# Patient Record
Sex: Female | Born: 2000 | Race: White | Hispanic: No | Smoking: Never smoker
Health system: Southern US, Community
[De-identification: ages and names within clinical notes are randomized; demographics above are authoritative.]

## PROBLEM LIST (undated history)

## (undated) ENCOUNTER — Inpatient Hospital Stay: Payer: Self-pay

## (undated) DIAGNOSIS — F419 Anxiety disorder, unspecified: Secondary | ICD-10-CM

## (undated) DIAGNOSIS — E669 Obesity, unspecified: Secondary | ICD-10-CM

## (undated) DIAGNOSIS — O4693 Antepartum hemorrhage, unspecified, third trimester: Secondary | ICD-10-CM

## (undated) DIAGNOSIS — J45909 Unspecified asthma, uncomplicated: Secondary | ICD-10-CM

## (undated) HISTORY — DX: Anxiety disorder, unspecified: F41.9

## (undated) HISTORY — DX: Antepartum hemorrhage, unspecified, third trimester: O46.93

## (undated) HISTORY — DX: Unspecified asthma, uncomplicated: J45.909

## (undated) HISTORY — PX: TONSILLECTOMY: SUR1361

---
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
## 2021-01-05 DIAGNOSIS — H5213 Myopia, bilateral: Secondary | ICD-10-CM | POA: Diagnosis not present

## 2021-02-20 ENCOUNTER — Other Ambulatory Visit: Payer: Self-pay

## 2021-02-20 ENCOUNTER — Ambulatory Visit
Admission: EM | Admit: 2021-02-20 | Discharge: 2021-02-20 | Disposition: A | Discharge status: Home / Self Care | Payer: Medicaid Other | Attending: Physician Assistant | Admitting: Physician Assistant

## 2021-02-20 ENCOUNTER — Encounter: Payer: Self-pay | Admitting: Emergency Medicine

## 2021-02-20 DIAGNOSIS — J45909 Unspecified asthma, uncomplicated: Secondary | ICD-10-CM | POA: Diagnosis not present

## 2021-02-20 DIAGNOSIS — M79602 Pain in left arm: Secondary | ICD-10-CM | POA: Insufficient documentation

## 2021-02-20 DIAGNOSIS — Z309 Encounter for contraceptive management, unspecified: Secondary | ICD-10-CM | POA: Diagnosis not present

## 2021-02-20 DIAGNOSIS — Z760 Encounter for issue of repeat prescription: Secondary | ICD-10-CM | POA: Diagnosis not present

## 2021-02-20 DIAGNOSIS — M5412 Radiculopathy, cervical region: Secondary | ICD-10-CM | POA: Diagnosis not present

## 2021-02-20 LAB — POCT PREGNANCY, URINE: Preg Test, Ur: NEGATIVE

## 2021-02-20 MED ORDER — PREDNISONE 10 MG PO TABS: ORAL_TABLET | ORAL | 0 refills | Status: DC

## 2021-02-20 MED ORDER — ALBUTEROL SULFATE HFA 108 (90 BASE) MCG/ACT IN AERS: 1.0000 | INHALATION_SPRAY | Freq: Four times a day (QID) | RESPIRATORY_TRACT | 0 refills | Status: DC | PRN

## 2021-02-20 MED ORDER — BACLOFEN 10 MG PO TABS: 10.0000 mg | ORAL_TABLET | Freq: Three times a day (TID) | ORAL | 0 refills | Status: DC | PRN

## 2021-02-20 MED ORDER — NORELGESTROMIN-ETH ESTRADIOL 150-35 MCG/24HR TD PTWK: 1.0000 | MEDICATED_PATCH | TRANSDERMAL | 2 refills | Status: DC

## 2021-02-20 NOTE — Discharge Instructions (Addendum)
Your arm pain is likely coming from your neck.  You likely have a pinched nerve.  I have sent prednisone to help reduce inflammation.  We will send a muscle relaxer.  You can take Tylenol 2 if you need to for pain.  You may need to follow-up with orthopedics if not getting better after the corticosteroids.  NECK PAIN: Stressed avoiding painful activities. This can exacerbate your symptoms and make them worse.  May apply heat to the areas of pain for some relief. Use medications as directed. Be aware of which medications make you drowsy and do not drive or operate any kind of heavy machinery while using the medication (ie pain medications or muscle relaxers). F/U with PCP for reexamination or return sooner if condition worsens or does not begin to improve over the next few days.   NECK PAIN RED FLAGS: If symptoms get worse than they are right now, you should come back sooner for re-evaluation. If you have increased numbness/ tingling or notice that the numbness/tingling is affecting the legs or saddle region, go to ER. If you ever lose continence go to ER.      MEDICATION REFILLS: I have refilled your birth control patch and also your albuterol inhaler.  Please contact Medicaid so that you can get set up with a primary care provider who can provide refills of these medications for you.  You also need routine lab work performed.

## 2021-02-20 NOTE — ED Provider Notes (Signed)
[facility]    CSN: [id number] Arrival date & time: 02/20/21  1517      History   Chief Complaint Chief Complaint  Patient presents with   Shoulder Pain    left    HPI Jennifer Lamb is a 20 y.o. female presenting with multiple complaints.  First she states that she has had chronic neck pain for "a while."  She also states that over the past month she has developed pain that radiates all the way down her left arm into her hand.  It is sometimes associated with numbness and tingling of all of the fingers.  She has full range of motion of her shoulder, elbow and wrist without pain.  No pain with movement of her neck.  Patient denies any injury.  She has not taken any medication to help relieve symptoms.  Additionally she complains of intermittent numbness and tingling of bilateral feet over the past 10 to 12 months.  Patient states that the symptoms resolve when she gets up and walks but she feels the symptoms whenever she sits.  She says she has been evaluated for this and was told to have low iron.  Patient does not have a PCP that she follows up with.  She says she was assessed by her OB/GYN.  She had a baby 10 months ago.  She is not breast-feeding.  She has not seen anyone recently for these complaints.  Additionally she requests a refill of her albuterol inhaler she has a history of asthma and is out of the inhaler.  She is not complaining of any chest pain or breathing difficulty at this time.  Next, she requests a refill of her birth control patch.  Patient states that her OB/GYN sent it in for her but the pharmacy did not fill it and she would like me to refill it.  She is sexually active and last time she had intercourse was 3 days ago but states that they used a condom.  Patient's last menstrual period was 12/26/2020.  Admits to irregular periods.  No other complaints.   HPI  Past Medical History:  Diagnosis Date   Anxiety    Asthma    History of blood  transfusion 10/2018   after Cesarean section 2020   Obese    Placenta previa antepartum in second trimester 07/30/2018   Placental abruption in third trimester 11/12/2018   Pregnancy 09/19/2018   Supervision of normal first teen pregnancy in first trimester 05/03/2018   Vaginal bleeding in pregnancy, third trimester     Patient Active Problem List   Diagnosis Date Noted   Normal labor 05/02/2020   Indication for care in labor or delivery 05/01/2020   Type A blood, Rh positive 04/20/2020   Obesity in pregnancy 10/29/2019   Adult BMI 40.0-44.9 kg/sq m (HCC) 10/29/2019   Supervision of high risk pregnancy, antepartum 10/29/2019   Previous cesarean section complicating pregnancy 10/29/2019    Past Surgical History:  Procedure Laterality Date   CESAREAN SECTION N/A 11/12/2018   Procedure: CESAREAN SECTION;  Surgeon: Hildred Laserherry, Anika, MD;  Location: [facility] ORS;  Service: Obstetrics;  Laterality: N/A;  STAT   TONSILLECTOMY     20 yrs old    OB History     Gravida  2   Para  2   Term  1   Preterm  1   AB      Living  2      SAB  IAB      Ectopic      Multiple  0   Live Births  2            Home Medications    Prior to Admission medications   Medication Sig Start Date End Date Taking? Authorizing Provider  albuterol (VENTOLIN HFA) 108 (90 Base) MCG/ACT inhaler Inhale 1-2 puffs into the lungs every 6 (six) hours as needed for wheezing or shortness of breath. 02/20/21 02/20/22 Yes Shirlee Latch, PA-C  baclofen (LIORESAL) 10 MG tablet Take 1 tablet (10 mg total) by mouth 3 (three) times daily as needed for muscle spasms. 02/20/21  Yes Shirlee Latch, PA-C  predniSONE (DELTASONE) 10 MG tablet Take 6 tabs p.o. on the first day and decrease by 1 tablet daily until complete 02/20/21  Yes Eusebio Friendly B, PA-C  ferrous sulfate 325 (65 FE) MG tablet Take 1 tablet (325 mg total) by mouth daily with breakfast. 05/04/20   Lawhorn, Vanessa [place], CNM  norelgestromin-ethinyl  estradiol (ORTHO EVRA) 150-35 MCG/24HR transdermal patch Place 1 patch onto the skin once a week. 02/20/21   Shirlee Latch, PA-C    Family History Family History  Problem Relation Age of Onset   Diabetes Maternal Grandmother    Healthy Mother    Healthy Father    Breast cancer Neg Hx     Social History Social History   Tobacco Use   Smoking status: Never   Smokeless tobacco: Never  Vaping Use   Vaping Use: Former  Substance Use Topics   Alcohol use: Never   Drug use: Never     Allergies   Patient has no known allergies.   Review of Systems Review of Systems  Constitutional:  Negative for fatigue and fever.  Respiratory:  Negative for cough, shortness of breath and wheezing.   Cardiovascular:  Negative for chest pain.  Endocrine: Negative for polyphagia and polyuria.  Musculoskeletal:  Positive for arthralgias, back pain (chronic) and neck pain. Negative for gait problem, joint swelling and neck stiffness.  Skin:  Negative for rash.  Neurological:  Negative for weakness and numbness.    Physical Exam Triage Vital Signs ED Triage Vitals  Enc Vitals Group     BP 02/20/21 1612 136/78     Pulse Rate 02/20/21 1612 83     Resp 02/20/21 1612 14     Temp 02/20/21 1612 98.5 F (36.9 C)     Temp Source 02/20/21 1612 Oral     SpO2 02/20/21 1612 98 %     Weight 02/20/21 1609 260 lb (117.9 kg)     Height 02/20/21 1609 5\' 2"  (1.575 m)     Head Circumference --      Peak Flow --      Pain Score 02/20/21 1609 5     Pain Loc --      Pain Edu? --      Excl. in GC? --    No data found.  Updated Vital Signs BP 136/78 (BP Location: Left Arm)   Pulse 83   Temp 98.5 F (36.9 C) (Oral)   Resp 14   Ht 5\' 2"  (1.575 m)   Wt 260 lb (117.9 kg)   LMP 12/26/2020 (Approximate)   SpO2 98%   Breastfeeding No   BMI 47.55 kg/m      Physical Exam Vitals and nursing note reviewed.  Constitutional:      General: She is not in acute distress.    Appearance: Normal  appearance. She is not ill-appearing or toxic-appearing.  HENT:     Head: Normocephalic and atraumatic.  Eyes:     General: No scleral icterus.       Right eye: No discharge.        Left eye: No discharge.     Conjunctiva/sclera: Conjunctivae normal.  Cardiovascular:     Rate and Rhythm: Normal rate and regular rhythm.     Heart sounds: Normal heart sounds.  Pulmonary:     Effort: Pulmonary effort is normal. No respiratory distress.     Breath sounds: Normal breath sounds.  Musculoskeletal:     Left shoulder: Normal.     Cervical back: Neck supple. Tenderness (diffuse TTP left paracervical muscles) present. No swelling, rigidity or bony tenderness. No pain with movement. Normal range of motion.     Lumbar back: Tenderness (diffuse TTP lumbar region bilaterally) present. Normal range of motion.     Comments: 5/5 strength bilat UE and LE  Skin:    General: Skin is dry.  Neurological:     General: No focal deficit present.     Mental Status: She is alert. Mental status is at baseline.     Motor: No weakness.     Gait: Gait normal.  Psychiatric:        Mood and Affect: Mood normal.        Behavior: Behavior normal.        Thought Content: Thought content normal.     UC Treatments / Results  Labs (all labs ordered are listed, but only abnormal results are displayed) Labs Reviewed  POC URINE PREG, ED  POCT PREGNANCY, URINE    EKG   Radiology No results found.  Procedures Procedures (including critical care time)  Medications Ordered in UC Medications - No data to display  Initial Impression / Assessment and Plan / UC Course  I have reviewed the triage vital signs and the nursing notes.  Pertinent labs & imaging results that were available during my care of the patient were reviewed by me and considered in my medical decision making (see chart for details).  20 year old female presented with multiple complaints.  She complains of left arm pain.  She also complains  of neck pain.  Additionally she requests refills of birth control patch and albuterol.  Vitals are all normal and stable today.  Patient does have diffuse tenderness of the left paracervical muscles.  Full range of motion of the left shoulder without pain.  Suspect that her left arm pain is due to cervical radiculopathy.  Treating her with prednisone and baclofen.  Advised her if not improving with this treatment plan that she should follow-up with Ortho as she may need an MRI of her neck to assess for underlying bulging disc or other central abnormality causing her symptoms.  Patient agrees.  Urine pregnancy test negative.  I did refill her birth control patch.  Advised her to follow-up with her OB/GYN for continued refills.  Also I refilled her albuterol inhaler.  Advised her to contact Medicaid for her PCP so that she can get refills from PCP.  In regards to her numbness and tingling of her feet, advised her there are many different causes for this including hormonal abnormalities, electrolyte disturbances, vitamin deficiencies such as B vitamins, diabetes, or back problems.  Advised patient she needs to be evaluated to have labs performed.  Again, encouraged her to contact Medicaid for an appoint with her primary care provider.  Patient says this  has been ongoing for nearly a year.  Advised her for any acute worsening of any of her symptoms to go to ED.  Final Clinical Impressions(s) / UC Diagnoses   Final diagnoses:  Cervical radiculopathy  Left arm pain  Encounter for medication refill  Uncomplicated asthma, unspecified asthma severity, unspecified whether persistent  Encounter for contraceptive management, unspecified type     Discharge Instructions      Your arm pain is likely coming from your neck.  You likely have a pinched nerve.  I have sent prednisone to help reduce inflammation.  We will send a muscle relaxer.  You can take Tylenol 2 if you need to for pain.  You may need to  follow-up with orthopedics if not getting better after the corticosteroids.  NECK PAIN: Stressed avoiding painful activities. This can exacerbate your symptoms and make them worse.  May apply heat to the areas of pain for some relief. Use medications as directed. Be aware of which medications make you drowsy and do not drive or operate any kind of heavy machinery while using the medication (ie pain medications or muscle relaxers). F/U with PCP for reexamination or return sooner if condition worsens or does not begin to improve over the next few days.   NECK PAIN RED FLAGS: If symptoms get worse than they are right now, you should come back sooner for re-evaluation. If you have increased numbness/ tingling or notice that the numbness/tingling is affecting the legs or saddle region, go to ER. If you ever lose continence go to ER.      MEDICATION REFILLS: I have refilled your birth control patch and also your albuterol inhaler.  Please contact Medicaid so that you can get set up with a primary care provider who can provide refills of these medications for you.  You also need routine lab work performed.   ED Prescriptions     Medication Sig Dispense Auth. Provider   norelgestromin-ethinyl estradiol (ORTHO EVRA) 150-35 MCG/24HR transdermal patch Place 1 patch onto the skin once a week. 3 patch Shirlee Latch, PA-C   albuterol (VENTOLIN HFA) 108 (90 Base) MCG/ACT inhaler Inhale 1-2 puffs into the lungs every 6 (six) hours as needed for wheezing or shortness of breath. 1 g Eusebio Friendly B, PA-C   predniSONE (DELTASONE) 10 MG tablet Take 6 tabs p.o. on the first day and decrease by 1 tablet daily until complete 21 tablet Eusebio Friendly B, PA-C   baclofen (LIORESAL) 10 MG tablet Take 1 tablet (10 mg total) by mouth 3 (three) times daily as needed for muscle spasms. 30 each Gareth Morgan      PDMP not reviewed this encounter.   Shirlee Latch, PA-C 02/20/21 1712

## 2021-02-20 NOTE — ED Triage Notes (Signed)
Patient c/o left shoulder pain that started a month ago.  Patient states that the pain radiates down her left arm.  Patient states that pain comes and goes.  Patient denies any injury or fall.

## 2021-08-10 ENCOUNTER — Ambulatory Visit: Payer: Self-pay

## 2021-08-12 ENCOUNTER — Other Ambulatory Visit: Payer: Self-pay

## 2021-08-12 ENCOUNTER — Encounter: Payer: Self-pay | Admitting: Emergency Medicine

## 2021-08-12 ENCOUNTER — Ambulatory Visit
Admission: EM | Admit: 2021-08-12 | Discharge: 2021-08-12 | Disposition: A | Discharge status: Home / Self Care | Payer: Medicaid Other | Attending: Emergency Medicine | Admitting: Emergency Medicine

## 2021-08-12 DIAGNOSIS — J069 Acute upper respiratory infection, unspecified: Secondary | ICD-10-CM

## 2021-08-12 DIAGNOSIS — R051 Acute cough: Secondary | ICD-10-CM

## 2021-08-12 MED ORDER — IPRATROPIUM BROMIDE 0.06 % NA SOLN: 2.0000 | Freq: Four times a day (QID) | NASAL | 12 refills | Status: DC

## 2021-08-12 MED ORDER — BENZONATATE 100 MG PO CAPS: 200.0000 mg | ORAL_CAPSULE | Freq: Three times a day (TID) | ORAL | 0 refills | Status: DC

## 2021-08-12 MED ORDER — PROMETHAZINE-DM 6.25-15 MG/5ML PO SYRP: 5.0000 mL | ORAL_SOLUTION | Freq: Four times a day (QID) | ORAL | 0 refills | Status: DC | PRN

## 2021-08-12 MED ORDER — ALBUTEROL SULFATE HFA 108 (90 BASE) MCG/ACT IN AERS: 2.0000 | INHALATION_SPRAY | RESPIRATORY_TRACT | 0 refills | Status: DC | PRN

## 2021-08-12 MED ORDER — AMOXICILLIN-POT CLAVULANATE 875-125 MG PO TABS: 1.0000 | ORAL_TABLET | Freq: Two times a day (BID) | ORAL | 0 refills | Status: AC

## 2021-08-12 NOTE — ED Provider Notes (Signed)
[facility]    CSN: [id number] Arrival date & time: 08/12/21  1344      History   Chief Complaint Chief Complaint  Patient presents with   Cough   Sore Throat    HPI Jennifer Lamb is a 21 y.o. female.   HPI  21 year old female here for evaluation of respiratory complaints.  Patient reports that for the last 2 to 3 weeks she has been experiencing nasal congestion, sore throat, ear pain, cough that is productive for green sputum, shortness of breath, and wheezing.  She has a history of asthma and has been using her rescue inhaler.  She denies any fever, nasal discharge, or GI complaints.  Past Medical History:  Diagnosis Date   Anxiety    Asthma    History of blood transfusion 10/2018   after Cesarean section 2020   Obese    Placenta previa antepartum in second trimester 07/30/2018   Placental abruption in third trimester 11/12/2018   Pregnancy 09/19/2018   Supervision of normal first teen pregnancy in first trimester 05/03/2018   Vaginal bleeding in pregnancy, third trimester     Patient Active Problem List   Diagnosis Date Noted   Normal labor 05/02/2020   Indication for care in labor or delivery 05/01/2020   Type A blood, Rh positive 04/20/2020   Obesity in pregnancy 10/29/2019   Adult BMI 40.0-44.9 kg/sq m (HCC) 10/29/2019   Supervision of high risk pregnancy, antepartum 10/29/2019   Previous cesarean section complicating pregnancy 10/29/2019    Past Surgical History:  Procedure Laterality Date   CESAREAN SECTION N/A 11/12/2018   Procedure: CESAREAN SECTION;  Surgeon: Hildred Laser, MD;  Location: [facility] ORS;  Service: Obstetrics;  Laterality: N/A;  STAT   TONSILLECTOMY     21 yrs old    OB History     Gravida  2   Para  2   Term  1   Preterm  1   AB      Living  2      SAB      IAB      Ectopic      Multiple  0   Live Births  2            Home Medications    Prior to Admission medications   Medication Sig Start  Date End Date Taking? Authorizing Provider  albuterol (VENTOLIN HFA) 108 (90 Base) MCG/ACT inhaler Inhale 2 puffs into the lungs every 4 (four) hours as needed. 08/12/21  Yes Becky Augusta, NP  amoxicillin-clavulanate (AUGMENTIN) 875-125 MG tablet Take 1 tablet by mouth every 12 (twelve) hours for 10 days. 08/12/21 08/22/21 Yes Becky Augusta, NP  benzonatate (TESSALON) 100 MG capsule Take 2 capsules (200 mg total) by mouth every 8 (eight) hours. 08/12/21  Yes Becky Augusta, NP  ipratropium (ATROVENT) 0.06 % nasal spray Place 2 sprays into both nostrils 4 (four) times daily. 08/12/21  Yes Becky Augusta, NP  promethazine-dextromethorphan (PROMETHAZINE-DM) 6.25-15 MG/5ML syrup Take 5 mLs by mouth 4 (four) times daily as needed. 08/12/21  Yes Becky Augusta, NP    Family History Family History  Problem Relation Age of Onset   Diabetes Maternal Grandmother    Healthy Mother    Healthy Father    Breast cancer Neg Hx     Social History Social History   Tobacco Use   Smoking status: Never   Smokeless tobacco: Never  Vaping Use   Vaping Use: Former  Substance Use Topics  Alcohol use: Never   Drug use: Never     Allergies   Patient has no known allergies.   Review of Systems Review of Systems  Constitutional:  Negative for activity change, appetite change and fever.  HENT:  Positive for congestion, ear pain, rhinorrhea and sore throat.   Respiratory:  Positive for cough, shortness of breath and wheezing.   Gastrointestinal:  Negative for diarrhea, nausea and vomiting.  Skin:  Negative for rash.  Hematological: Negative.   Psychiatric/Behavioral: Negative.      Physical Exam Triage Vital Signs ED Triage Vitals  Enc Vitals Group     BP 08/12/21 1354 (!) 105/91     Pulse Rate 08/12/21 1354 (!) 112     Resp 08/12/21 1354 14     Temp 08/12/21 1354 98.3 F (36.8 C)     Temp Source 08/12/21 1354 Oral     SpO2 08/12/21 1354 100 %     Weight 08/12/21 1351 250 lb (113.4 kg)     Height  08/12/21 1351 5\' 2"  (1.575 m)     Head Circumference --      Peak Flow --      Pain Score 08/12/21 1350 2     Pain Loc --      Pain Edu? --      Excl. in GC? --    No data found.  Updated Vital Signs BP (!) 105/91 (BP Location: Left Arm)    Pulse (!) 112    Temp 98.3 F (36.8 C) (Oral)    Resp 14    Ht 5\' 2"  (1.575 m)    Wt 250 lb (113.4 kg)    LMP 07/08/2021 (Approximate)    SpO2 100%    Breastfeeding No    BMI 45.73 kg/m   Visual Acuity Right Eye Distance:   Left Eye Distance:   Bilateral Distance:    Right Eye Near:   Left Eye Near:    Bilateral Near:     Physical Exam Vitals and nursing note reviewed.  Constitutional:      General: She is not in acute distress.    Appearance: Normal appearance. She is not ill-appearing.  HENT:     Head: Normocephalic and atraumatic.     Right Ear: Tympanic membrane, ear canal and external ear normal. There is no impacted cerumen.     Left Ear: Tympanic membrane, ear canal and external ear normal. There is no impacted cerumen.     Nose: Congestion and rhinorrhea present.     Mouth/Throat:     Mouth: Mucous membranes are moist.     Pharynx: Oropharynx is clear. Posterior oropharyngeal erythema present.  Cardiovascular:     Rate and Rhythm: Normal rate and regular rhythm.     Pulses: Normal pulses.     Heart sounds: Normal heart sounds. No murmur heard.   No friction rub. No gallop.  Pulmonary:     Effort: Pulmonary effort is normal.     Breath sounds: Normal breath sounds. No wheezing, rhonchi or rales.  Musculoskeletal:     Cervical back: Normal range of motion and neck supple.  Lymphadenopathy:     Cervical: Cervical adenopathy present.  Skin:    General: Skin is warm and dry.     Capillary Refill: Capillary refill takes less than 2 seconds.     Findings: No erythema or rash.  Neurological:     General: No focal deficit present.     Mental Status: She is alert and  oriented to person, place, and time.  Psychiatric:         Mood and Affect: Mood normal.        Behavior: Behavior normal.        Thought Content: Thought content normal.        Judgment: Judgment normal.     UC Treatments / Results  Labs (all labs ordered are listed, but only abnormal results are displayed) Labs Reviewed - No data to display  EKG   Radiology No results found.  Procedures Procedures (including critical care time)  Medications Ordered in UC Medications - No data to display  Initial Impression / Assessment and Plan / UC Course  I have reviewed the triage vital signs and the nursing notes.  Pertinent labs & imaging results that were available during my care of the patient were reviewed by me and considered in my medical decision making (see chart for details).  Patient is a pleasant, nontoxic-appearing [age] year old female here for evaluation of respiratory complaints that been going on for last 2 to 3 weeks consist of nasal congestion, sore throat, ear pain, productive cough for green sputum, shortness of breath, and wheezing.  She denies any known sick contacts.  On physical exam patient has pearly-gray tympanic membranes bilaterally with normal light reflex and clear external auditory canals.  Nasal mucosa is erythematous and edematous with clear discharge in both nares.  Oropharyngeal exam reveals posterior oropharyngeal erythema with clear postnasal drip.  Patient does have bilateral anterior cervical lymphadenopathy on exam.  Cardiopulmonary exam reveals clear lung sounds in all fields.  Patient is requesting a refill of her albuterol inhaler and I am happy to do so.  Given the fact that she had symptoms for 2 to 3 weeks a trial of antibiotics is warranted and will prescribe Augmentin twice daily for 10 days.  To help with nasal congestion I will prescribe Atrovent nasal spray, Tessalon Perles, and Promethazine DM cough syrup.   Final Clinical Impressions(s) / UC Diagnoses   Final diagnoses:  Upper respiratory tract  infection, unspecified type  Acute cough     Discharge Instructions      Take the Augmentin twice daily with food for 10 days for your URI.   Use the Atrovent nasal spray, 2 squirts in each nostril every 6 hours, as needed for runny nose and postnasal drip.  Use the Tessalon Perles every 8 hours during the day.  Take them with a small sip of water.  They may give you some numbness to the base of your tongue or a metallic taste in your mouth, this is normal.  Use the Promethazine DM cough syrup at bedtime for cough and congestion.  It will make you drowsy so do not take it during the day.  Return for reevaluation or see your primary care provider for any new or worsening symptoms.      ED Prescriptions     Medication Sig Dispense Auth. Provider   albuterol (VENTOLIN HFA) 108 (90 Base) MCG/ACT inhaler Inhale 2 puffs into the lungs every 4 (four) hours as needed. 18 g Becky Augustayan, Melaya [place]el[place], NP   benzonatate (TESSALON) 100 MG capsule Take 2 capsules (200 mg total) by mouth every 8 (eight) hours. 21 capsule Becky Augustayan, Leah Skora, NP   amoxicillin-clavulanate (AUGMENTIN) 875-125 MG tablet Take 1 tablet by mouth every 12 (twelve) hours for 10 days. 20 tablet Becky Augustayan, Isahia Hollerbach, NP   ipratropium (ATROVENT) 0.06 % nasal spray Place 2 sprays into both nostrils 4 (four) times daily.  15 mL Becky Augustayan, Taeden Geller, NP   promethazine-dextromethorphan (PROMETHAZINE-DM) 6.25-15 MG/5ML syrup Take 5 mLs by mouth 4 (four) times daily as needed. 118 mL Becky Augustayan, Jaysion Ramseyer, NP      PDMP not reviewed this encounter.   Becky Augustayan, Giavonni Fonder, NP 08/12/21 1433

## 2021-08-12 NOTE — ED Triage Notes (Signed)
Patient c/o cough, congestion, sore throat that started 2-3 weeks ago.  Patient reports SOB that started earlier this week.  Patient denies fevers.

## 2021-08-12 NOTE — Discharge Instructions (Signed)
Take the Augmentin twice daily with food for 10 days for your URI.   Use the Atrovent nasal spray, 2 squirts in each nostril every 6 hours, as needed for runny nose and postnasal drip.  Use the Tessalon Perles every 8 hours during the day.  Take them with a small sip of water.  They may give you some numbness to the base of your tongue or a metallic taste in your mouth, this is normal.  Use the Promethazine DM cough syrup at bedtime for cough and congestion.  It will make you drowsy so do not take it during the day.  Return for reevaluation or see your primary care provider for any new or worsening symptoms.

## 2021-10-18 ENCOUNTER — Encounter: Payer: Medicaid Other | Admitting: Certified Nurse Midwife

## 2021-10-31 ENCOUNTER — Encounter: Payer: Medicaid Other | Admitting: Certified Nurse Midwife

## 2022-05-15 ENCOUNTER — Encounter: Payer: Self-pay | Admitting: Certified Nurse Midwife

## 2022-07-21 ENCOUNTER — Emergency Department
Admission: EM | Admit: 2022-07-21 | Discharge: 2022-07-21 | Disposition: A | Discharge status: Home / Self Care | Payer: Self-pay | Attending: Emergency Medicine | Admitting: Emergency Medicine

## 2022-07-21 ENCOUNTER — Encounter: Payer: Self-pay | Admitting: Emergency Medicine

## 2022-07-21 ENCOUNTER — Ambulatory Visit
Admission: EM | Admit: 2022-07-21 | Discharge: 2022-07-21 | Disposition: A | Discharge status: Home / Self Care | Payer: Medicaid Other | Attending: Family Medicine | Admitting: Family Medicine

## 2022-07-21 ENCOUNTER — Other Ambulatory Visit: Payer: Self-pay

## 2022-07-21 DIAGNOSIS — J45909 Unspecified asthma, uncomplicated: Secondary | ICD-10-CM | POA: Insufficient documentation

## 2022-07-21 DIAGNOSIS — L500 Allergic urticaria: Secondary | ICD-10-CM | POA: Insufficient documentation

## 2022-07-21 DIAGNOSIS — T7840XA Allergy, unspecified, initial encounter: Secondary | ICD-10-CM

## 2022-07-21 DIAGNOSIS — T782XXA Anaphylactic shock, unspecified, initial encounter: Secondary | ICD-10-CM

## 2022-07-21 MED ORDER — DIPHENHYDRAMINE HCL 25 MG PO CAPS
50.0000 mg | ORAL_CAPSULE | Freq: Once | ORAL | Status: AC
  Administered 2022-07-21: 50 mg via ORAL
  Filled 2022-07-21: qty 2

## 2022-07-21 MED ORDER — EPINEPHRINE PF 1 MG/ML IJ SOLN
0.3000 mg | Freq: Once | INTRAMUSCULAR | Status: AC
  Administered 2022-07-21: 0.3 mg via SUBCUTANEOUS

## 2022-07-21 MED ORDER — DEXAMETHASONE SODIUM PHOSPHATE 10 MG/ML IJ SOLN
10.0000 mg | Freq: Once | INTRAMUSCULAR | Status: AC
  Administered 2022-07-21: 10 mg via INTRAMUSCULAR

## 2022-07-21 MED ORDER — EPINEPHRINE 0.3 MG/0.3ML IJ SOAJ: 0.3000 mg | INTRAMUSCULAR | 0 refills | Status: DC | PRN

## 2022-07-21 MED ORDER — DIPHENHYDRAMINE HCL 50 MG/ML IJ SOLN
25.0000 mg | Freq: Once | INTRAMUSCULAR | Status: AC
  Administered 2022-07-21: 25 mg via INTRAMUSCULAR

## 2022-07-21 MED ORDER — PREDNISONE 20 MG PO TABS: 60.0000 mg | ORAL_TABLET | Freq: Every day | ORAL | 0 refills | Status: AC

## 2022-07-21 MED ORDER — FAMOTIDINE 20 MG PO TABS
20.0000 mg | ORAL_TABLET | Freq: Once | ORAL | Status: AC
  Administered 2022-07-21: 20 mg via ORAL
  Filled 2022-07-21: qty 1

## 2022-07-21 NOTE — ED Provider Notes (Signed)
[facility]    CSN: [id number] Arrival date & time: 07/21/22  1618      History   Chief Complaint Chief Complaint  Patient presents with   Urticaria    HPI Jennifer Lamb is a 21 y.o. female.   HPI   Jennifer Lamb presents for hives. She was playing with her baby around 7 AM and noticed a breakout on her neck. Rash has been spreading and is itchy. She put some calamine lotion on it without relief. She put on some lotion from [employer] and [employer] and recently switched up her laundry detergent. No meds or supplements or foods. No similar sx previous.   She has experienced some shortness of breath, feels like her throat is closing, and vomited once.     Past Medical History:  Diagnosis Date   Anxiety    Asthma    History of blood transfusion 10/2018   after Cesarean section 2020   Obese    Placenta previa antepartum in second trimester 07/30/2018   Placental abruption in third trimester 11/12/2018   Pregnancy 09/19/2018   Supervision of normal first teen pregnancy in first trimester 05/03/2018   Vaginal bleeding in pregnancy, third trimester     Patient Active Problem List   Diagnosis Date Noted   Normal labor 05/02/2020   Indication for care in labor or delivery 05/01/2020   Type A blood, Rh positive 04/20/2020   Obesity in pregnancy 10/29/2019   Adult BMI 40.0-44.9 kg/sq m (HCC) 10/29/2019   Supervision of high risk pregnancy, antepartum 10/29/2019   Previous cesarean section complicating pregnancy 10/29/2019    Past Surgical History:  Procedure Laterality Date   CESAREAN SECTION N/A 11/12/2018   Procedure: CESAREAN SECTION;  Surgeon: Hildred Laser, MD;  Location: [facility] ORS;  Service: Obstetrics;  Laterality: N/A;  STAT   TONSILLECTOMY     21 yrs old    OB History     Gravida  2   Para  2   Term  1   Preterm  1   AB      Living  2      SAB      IAB      Ectopic      Multiple  0   Live Births  2            Home  Medications    Prior to Admission medications   Medication Sig Start Date End Date Taking? Authorizing Provider  albuterol (VENTOLIN HFA) 108 (90 Base) MCG/ACT inhaler Inhale 2 puffs into the lungs every 4 (four) hours as needed. 08/12/21   Becky Augusta, NP  benzonatate (TESSALON) 100 MG capsule Take 2 capsules (200 mg total) by mouth every 8 (eight) hours. 08/12/21   Becky Augusta, NP  ipratropium (ATROVENT) 0.06 % nasal spray Place 2 sprays into both nostrils 4 (four) times daily. 08/12/21   Becky Augusta, NP  promethazine-dextromethorphan (PROMETHAZINE-DM) 6.25-15 MG/5ML syrup Take 5 mLs by mouth 4 (four) times daily as needed. 08/12/21   Becky Augusta, NP    Family History Family History  Problem Relation Age of Onset   Diabetes Maternal Grandmother    Healthy Mother    Healthy Father    Breast cancer Neg Hx     Social History Social History   Tobacco Use   Smoking status: Never   Smokeless tobacco: Never  Vaping Use   Vaping Use: Former  Substance Use Topics   Alcohol use: Never   Drug  use: Never     Allergies   Patient has no known allergies.   Review of Systems Review of Systems: negative unless otherwise stated in HPI.      Physical Exam Triage Vital Signs ED Triage Vitals  Enc Vitals Group     BP 07/21/22 1721 (!) 123/91     Pulse Rate 07/21/22 1721 (!) 102     Resp --      Temp 07/21/22 1721 97.8 F (36.6 C)     Temp Source 07/21/22 1721 Oral     SpO2 07/21/22 1721 100 %     Weight 07/21/22 1718 274 lb (124.3 kg)     Height 07/21/22 1718 5\' 2"  (1.575 m)     Head Circumference --      Peak Flow --      Pain Score 07/21/22 1718 0     Pain Loc --      Pain Edu? --      Excl. in GC? --    No data found.  Updated Vital Signs BP (!) 117/98 (BP Location: Left Arm)   Pulse (!) 110   Temp 97.8 F (36.6 C) (Oral)   Resp 14   Ht 5\' 2"  (1.575 m)   Wt 124.3 kg   LMP 07/07/2022 (Approximate)   SpO2 100%   BMI 50.12 kg/m   Visual Acuity Right Eye  Distance:   Left Eye Distance:   Bilateral Distance:    Right Eye Near:   Left Eye Near:    Bilateral Near:     Physical Exam GEN:     alert, non-toxic appearing female in no distress    HENT:  mucus membranes moist, oropharyngeal without lesions, elongated uvula, no nasal discharge EYES:   pupils equal and reactive, no scleral injection or discharge NECK:  normal ROM RESP:  no increased work of breathing, clear to auscultation bilaterally, no wheezing, rales, rhonchi CVS:   regular rhythm, tachycardic  Skin:   warm and dry, diffuse erythematous patches, cyanosis of fingers tips and toes     UC Treatments / Results  Labs (all labs ordered are listed, but only abnormal results are displayed) Labs Reviewed - No data to display  EKG   Radiology No results found.  Procedures Procedures (including critical care time)  Medications Ordered in UC Medications  dexamethasone (DECADRON) injection 10 mg (10 mg Intramuscular Given 07/21/22 1747)  diphenhydrAMINE (BENADRYL) injection 25 mg (25 mg Intramuscular Given 07/21/22 1747)  EPINEPHrine (ADRENALIN) 0.3 mg (0.3 mg Subcutaneous Given 07/21/22 1747)    Initial Impression / Assessment and Plan / UC Course  I have reviewed the triage vital signs and the nursing notes.  Pertinent labs & imaging results that were available during my care of the patient were reviewed by me and considered in my medical decision making (see chart for details).      Pt is a 21 y.o. female who presents for acute hives this morning. Jennifer Lamb is tachycardic with distal cyanosis. She feels short of breath though sats are in the 100%.  She vomited once.  I am concerned that she is anaphylactic.  She did not take any Benadryl at the onset of her symptoms as she has never had hives before.  She has never had hives before.  She was given IM Decadron 10 mg, 25 mg of Benadryl with epinephrine 0.3.  EMS was called.  Upon arrival, patient is agreeable to go to the  hospital. She texted her family member to  alert then that she will be traveling to the hospital.  She will travel with EMS to the [facility] regional ED.   Return and ED precautions given and voiced understanding. Discussed MDM, treatment plan and plan for follow-up with patient who agrees with plan.     Final Clinical Impressions(s) / UC Diagnoses   Final diagnoses:  Anaphylaxis, initial encounter     Discharge Instructions      You have some anaphylaxis.  You were given epinephrine, steroids and benadryl and will need to be evaluated in the emergency department.  You will travel there by EMS.     ED Prescriptions   None    PDMP not reviewed this encounter.   Katha Cabal, DO 07/21/22 1810

## 2022-07-21 NOTE — Discharge Instructions (Addendum)
You have some anaphylaxis.  You were given epinephrine, steroids and benadryl and will need to be evaluated in the emergency department.  You will travel there by EMS.

## 2022-07-21 NOTE — ED Triage Notes (Signed)
Pt to er room number 18 hallway via ems, per ems pt is from urgent care, states that she had a rash and hives all day, states that at urgent care she started to have some anxiety and was told she was having an anaphylactic reaction and was given, epi, benadryl and some decadron.  Pt states that she had some tightness in her throat when she was vomiting earlier today, but never felt like she couldn't breath,  states that they scared her at urgent care and that is what caused the sob.  Pt denies itchiness at this time, talking in full sentences, resps even and unlabored.

## 2022-07-21 NOTE — ED Triage Notes (Signed)
Patient reports hives that started all over her body this morning.  Patient reports putting on some body lotion last night before going to bed that might have caused this.

## 2022-07-21 NOTE — ED Notes (Signed)
EMS HAS BEEN CALLED AND IS ON ROUTE TO TRANSFER TO ED

## 2022-07-21 NOTE — ED Notes (Signed)
Hives to entire body. Swelling and redness. Meds given. Denies SOB or CP.

## 2022-07-21 NOTE — ED Notes (Signed)
Patient is being discharged from the Urgent Care and sent to the Emergency Department via EMS . Per Dr.Brimage, patient is in need of higher level of care due to Vomiting and Shortness of Breath. Patient is aware and verbalizes understanding of plan of care.  Vitals:   07/21/22 1721  BP: (!) 123/91  Pulse: (!) 102  Temp: 97.8 F (36.6 C)  SpO2: 100%

## 2022-07-21 NOTE — Discharge Instructions (Addendum)
Take benadryl (diphenhydramine) which you can get over the counter, 25mg  every 4 hours or 50mg  every 6 hours.  You can take pepcid (famotidine) 20mg  twice daily as well.  Take the prednisone as prescribed for 4 days starting tomorrow.  We have also prescribed an EpiPen.  This should be used if you have a severe allergic reaction including any shortness of breath tightness in your throat, or severe worsening hives.  Return to the ER immediately for new or worsening symptoms such as shortness of breath, throat tightness, worsening hives, fever, weakness, or any other new or worsening symptoms that concern you.

## 2022-07-21 NOTE — ED Provider Notes (Signed)
[facility] Provider Note    Event Date/Time   First MD Initiated Contact with Patient 07/21/22 1837     (approximate)   History   Allergic Reaction   HPI  Jennifer Lamb is a 21 y.o. female with history of asthma and anxiety who presents with concern for allergic reaction acute onset today.  The patient states that she was exposed to a new detergent.  She woke up today with hives all over her body that were very itchy.  She did not take anything for them.  Eventually she went to urgent care early this evening.  At that time she reported shortness of breath and also had 1 episode of vomiting.  She was then given epinephrine, Benadryl, and Decadron by urgent care and sent to the ED.  The patient states that she did feel like her throat was somewhat tight.  She felt slightly short of breath but the symptoms have both resolved.  She no longer feels nauseous.  She denies any prior history of allergic reactions like this and denies any known allergens.  I reviewed the medical records including the urgent care note from today.  The urgent care provider was concerned for anaphylaxis and the patient received IM epinephrine, Benadryl, and dexamethasone at approximately 1745.   Physical Exam   Triage Vital Signs: ED Triage Vitals  Enc Vitals Group     BP 07/21/22 1831 (!) 116/58     Pulse Rate 07/21/22 1831 (!) 104     Resp 07/21/22 1831 17     Temp 07/21/22 1831 98.1 F (36.7 C)     Temp Source 07/21/22 1831 Oral     SpO2 07/21/22 1831 98 %     Weight 07/21/22 1829 275 lb (124.7 kg)     Height 07/21/22 1829 5\' 2"  (1.575 m)     Head Circumference --      Peak Flow --      Pain Score 07/21/22 1829 0     Pain Loc --      Pain Edu? --      Excl. in GC? --     Most recent vital signs: Vitals:   07/21/22 1831  BP: (!) 116/58  Pulse: (!) 104  Resp: 17  Temp: 98.1 F (36.7 C)  SpO2: 98%     General: Awake, no distress.  CV:  Good peripheral  perfusion.  Resp:  Normal effort.  Lungs CTAB. Abd:  No distention.  Other:  Oropharynx clear with no swelling or erythema.  No stridor or pooled secretions.  Clear voice.  A few faint scattered urticaria to the upper body.   ED Results / Procedures / Treatments   Labs (all labs ordered are listed, but only abnormal results are displayed) Labs Reviewed - No data to display   EKG     RADIOLOGY    PROCEDURES:  Critical Care performed: No  Procedures   MEDICATIONS ORDERED IN ED: Medications - No data to display   IMPRESSION / MDM / ASSESSMENT AND PLAN / ED COURSE  I reviewed the triage vital signs and the nursing notes.  21 year old female with PMH as noted above presents for concern of possible anaphylactic reaction although she mainly had hives and just minimal discomfort in her throat.  The symptoms have now resolved after epinephrine, Decadron, and Benadryl.  Differential diagnosis includes, but is not limited to, anaphylaxis, allergic reaction.  Patient's presentation is most consistent with acute presentation with potential threat  to life or bodily function.  At this time there is no indication for lab workup.  We will observe the patient for approximately 3 hours from the time that she was given the epinephrine and if she has no recurrence of symptoms she will likely be appropriate for discharge home.  ----------------------------------------- 10:08 PM on 07/21/2022 -----------------------------------------    FINAL CLINICAL IMPRESSION(S) / ED DIAGNOSES   Final diagnoses:  None     Rx / DC Orders   ED Discharge Orders     None        Note:  This document was prepared using Dragon voice recognition software and may include unintentional dictation errors.

## 2022-10-09 ENCOUNTER — Encounter: Payer: Self-pay | Admitting: Emergency Medicine

## 2022-10-09 ENCOUNTER — Ambulatory Visit
Admission: EM | Admit: 2022-10-09 | Discharge: 2022-10-09 | Disposition: A | Discharge status: Home / Self Care | Payer: Medicaid Other | Attending: Emergency Medicine | Admitting: Emergency Medicine

## 2022-10-09 DIAGNOSIS — R21 Rash and other nonspecific skin eruption: Secondary | ICD-10-CM

## 2022-10-09 MED ORDER — PREDNISONE 10 MG (21) PO TBPK: ORAL_TABLET | Freq: Every day | ORAL | 0 refills | Status: DC

## 2022-10-09 NOTE — ED Triage Notes (Signed)
Pt presents with an itchy rash on her lower back x 3 days. Pt has tried Benadryl and Calamine lotion for her symptoms.

## 2022-10-09 NOTE — Discharge Instructions (Signed)
Rash appears inflammatory, no current signs of infection  Begin prednisone starting tomorrow as directed to reduce the inflammatory process which will slowly clear the rash  You may use oral or topical Benadryl to help manage itching, may continue use of calamine lotion as needed  Avoid long exposure to heat such as in the showers as this may cause further irritation  If your rash continues to persist please follow-up for reevaluation,   at any point if you begin to have trouble breathing administer your EpiPen and go to the nearest hospital

## 2022-10-09 NOTE — ED Provider Notes (Signed)
[facility]    CSN: [id number] Arrival date & time: 10/09/22  1517      History   Chief Complaint Chief Complaint  Patient presents with   Urticaria    HPI Jennifer Lamb is a 22 y.o. female.   Presents for rash present to the lower back for 3 days.  Rash is pruritic, nondraining or painful.  Has attempted use of calamine lotion and oral Benadryl which has been ineffective.  Endorses changing detergents 1 month ago, unsure if related.  Had similar symptoms with respiratory distress after changing detergents in December 2023.   Past Medical History:  Diagnosis Date   Anxiety    Asthma    History of blood transfusion 10/2018   after Cesarean section 2020   Obese    Placenta previa antepartum in second trimester 07/30/2018   Placental abruption in third trimester 11/12/2018   Pregnancy 09/19/2018   Supervision of normal first teen pregnancy in first trimester 05/03/2018   Vaginal bleeding in pregnancy, third trimester     Patient Active Problem List   Diagnosis Date Noted   Normal labor 05/02/2020   Indication for care in labor or delivery 05/01/2020   Type A blood, Rh positive 04/20/2020   Obesity in pregnancy 10/29/2019   Adult BMI 40.0-44.9 kg/sq m ([place]) 10/29/2019   Supervision of high risk pregnancy, antepartum 10/29/2019   Previous cesarean section complicating pregnancy [id number]    Past Surgical History:  Procedure Laterality Date   CESAREAN SECTION N/A 11/12/2018   Procedure: CESAREAN SECTION;  Surgeon: Rubie Maid, MD;  Location: [facility] ORS;  Service: Obstetrics;  Laterality: N/A;  STAT   TONSILLECTOMY     22 yrs old    OB History     Gravida  2   Para  2   Term  1   Preterm  1   AB      Living  2      SAB      IAB      Ectopic      Multiple  0   Live Births  2            Home Medications    Prior to Admission medications   Medication Sig Start Date End Date Taking? Authorizing Provider  albuterol (VENTOLIN  HFA) 108 (90 Base) MCG/ACT inhaler Inhale 2 puffs into the lungs every 4 (four) hours as needed. 08/12/21   Margarette Canada, NP  benzonatate (TESSALON) 100 MG capsule Take 2 capsules (200 mg total) by mouth every 8 (eight) hours. 08/12/21   Margarette Canada, NP  EPINEPHrine 0.3 mg/0.3 mL IJ SOAJ injection Inject 0.3 mg into the muscle as needed for anaphylaxis. 07/21/22   Arta Silence, MD  ipratropium (ATROVENT) 0.06 % nasal spray Place 2 sprays into both nostrils 4 (four) times daily. 08/12/21   Margarette Canada, NP  promethazine-dextromethorphan (PROMETHAZINE-DM) 6.25-15 MG/5ML syrup Take 5 mLs by mouth 4 (four) times daily as needed. 08/12/21   Margarette Canada, NP    Family History Family History  Problem Relation Age of Onset   Diabetes Maternal Grandmother    Healthy Mother    Healthy Father    Breast cancer Neg Hx     Social History Social History   Tobacco Use   Smoking status: Never   Smokeless tobacco: Never  Vaping Use   Vaping Use: Every day  Substance Use Topics   Alcohol use: Never   Drug use: Never  Allergies   Patient has no known allergies.   Review of Systems Review of Systems  Constitutional: Negative.   HENT: Negative.    Respiratory: Negative.    Cardiovascular: Negative.   Skin:  Positive for rash. Negative for color change, pallor and wound.     Physical Exam Triage Vital Signs ED Triage Vitals  Enc Vitals Group     BP 10/09/22 1557 121/89     Pulse Rate 10/09/22 1557 89     Resp 10/09/22 1557 18     Temp 10/09/22 1557 98.5 F (36.9 C)     Temp Source 10/09/22 1557 Oral     SpO2 10/09/22 1557 96 %     Weight --      Height --      Head Circumference --      Peak Flow --      Pain Score 10/09/22 1556 0     Pain Loc --      Pain Edu? --      Excl. in [place]? --    No data found.  Updated Vital Signs BP 121/89 (BP Location: Left Wrist)   Pulse 89   Temp 98.5 F (36.9 C) (Oral)   Resp 18   LMP 10/07/2022   SpO2 96%   Visual  Acuity Right Eye Distance:   Left Eye Distance:   Bilateral Distance:    Right Eye Near:   Left Eye Near:    Bilateral Near:     Physical Exam Constitutional:      Appearance: Normal appearance.  HENT:     Head: Normocephalic.  Eyes:     Extraocular Movements: Extraocular movements intact.  Pulmonary:     Effort: Pulmonary effort is normal.  Skin:    Comments: Hives are present to the bilateral lower back  Neurological:     Mental Status: She is alert and oriented to person, place, and time. Mental status is at baseline.      UC Treatments / Results  Labs (all labs ordered are listed, but only abnormal results are displayed) Labs Reviewed - No data to display  EKG   Radiology No results found.  Procedures Procedures (including critical care time)  Medications Ordered in UC Medications - No data to display  Initial Impression / Assessment and Plan / UC Course  I have reviewed the triage vital signs and the nursing notes.  Pertinent labs & imaging results that were available during my care of the patient were reviewed by me and considered in my medical decision making (see chart for details).  Rash  Appears inflammatory, no current signs of infection, discussed with patient, prescribed prednisone, declined steroid injection, recommended continued use oral oral or topical antihistamines and calamine lotion for management of pruritus, advised avoidance of long exposure to heat to prevent further irritation, may follow-up with urgent care as needed, primary care walking referral given Final Clinical Impressions(s) / UC Diagnoses   Final diagnoses:  None   Discharge Instructions   None    ED Prescriptions   None    PDMP not reviewed this encounter.   Hans Eden, NP 10/09/22 1609

## 2022-10-16 ENCOUNTER — Ambulatory Visit: Payer: Medicaid Other | Admitting: Physician Assistant

## 2022-10-16 ENCOUNTER — Encounter: Payer: Self-pay | Admitting: General Practice

## 2022-12-15 ENCOUNTER — Ambulatory Visit
Admission: EM | Admit: 2022-12-15 | Discharge: 2022-12-15 | Disposition: A | Discharge status: Home / Self Care | Payer: Medicaid Other | Attending: Family Medicine | Admitting: Family Medicine

## 2022-12-15 ENCOUNTER — Encounter: Payer: Self-pay | Admitting: Emergency Medicine

## 2022-12-15 DIAGNOSIS — J4521 Mild intermittent asthma with (acute) exacerbation: Secondary | ICD-10-CM

## 2022-12-15 MED ORDER — BENZONATATE 100 MG PO CAPS: 200.0000 mg | ORAL_CAPSULE | Freq: Three times a day (TID) | ORAL | 0 refills | Status: DC

## 2022-12-15 MED ORDER — AMOXICILLIN-POT CLAVULANATE 875-125 MG PO TABS: 1.0000 | ORAL_TABLET | Freq: Two times a day (BID) | ORAL | 0 refills | Status: DC

## 2022-12-15 MED ORDER — ALBUTEROL SULFATE HFA 108 (90 BASE) MCG/ACT IN AERS: 2.0000 | INHALATION_SPRAY | RESPIRATORY_TRACT | 2 refills | Status: DC | PRN

## 2022-12-15 MED ORDER — PREDNISONE 10 MG (21) PO TBPK: ORAL_TABLET | Freq: Every day | ORAL | 0 refills | Status: DC

## 2022-12-15 NOTE — Discharge Instructions (Addendum)
Stop by the pharmacy to pick up your prescriptions.  Follow up with your primary care provider as needed.  

## 2022-12-15 NOTE — ED Provider Notes (Signed)
[facility]    CSN: [id number] Arrival date & time: 12/15/22  1420      History   Chief Complaint Chief Complaint  Patient presents with   Cough    HPI Jennifer Lamb is a 22 y.o. female.   HPI   Jennifer Lamb presents for 2 weeks of cough and congestion. Started having a cold. Her partner and son had the same but they are getting better.   No fever, vomiting, diarrhea, eating and drinking well, rash or sore throat. No treatments prior to arrival.  She has asthma.       Past Medical History:  Diagnosis Date   Anxiety    Asthma    History of blood transfusion 10/2018   after Cesarean section 2020   Obese    Placenta previa antepartum in second trimester 07/30/2018   Placental abruption in third trimester 11/12/2018   Pregnancy 09/19/2018   Supervision of normal first teen pregnancy in first trimester 05/03/2018   Vaginal bleeding in pregnancy, third trimester     Patient Active Problem List   Diagnosis Date Noted   Normal labor 05/02/2020   Indication for care in labor or delivery 05/01/2020   Type A blood, Rh positive 04/20/2020   Obesity in pregnancy 10/29/2019   Adult BMI 40.0-44.9 kg/sq m (HCC) 10/29/2019   Supervision of high risk pregnancy, antepartum 10/29/2019   Previous cesarean section complicating pregnancy 10/29/2019    Past Surgical History:  Procedure Laterality Date   CESAREAN SECTION N/A 11/12/2018   Procedure: CESAREAN SECTION;  Surgeon: Hildred Laser, MD;  Location: [facility] ORS;  Service: Obstetrics;  Laterality: N/A;  STAT   TONSILLECTOMY     22 yrs old    OB History     Gravida  2   Para  2   Term  1   Preterm  1   AB      Living  2      SAB      IAB      Ectopic      Multiple  0   Live Births  2            Home Medications    Prior to Admission medications   Medication Sig Start Date End Date Taking? Authorizing Provider  amoxicillin-clavulanate (AUGMENTIN) 875-125 MG tablet Take 1 tablet by mouth  every 12 (twelve) hours. 12/15/22  Yes Peta Peachey, DO  albuterol (VENTOLIN HFA) 108 (90 Base) MCG/ACT inhaler Inhale 2 puffs into the lungs every 4 (four) hours as needed. 12/15/22   Enjoli Tidd, Seward Meth, DO  benzonatate (TESSALON) 100 MG capsule Take 2 capsules (200 mg total) by mouth every 8 (eight) hours. 12/15/22   Dhilan Brauer, Seward Meth, DO  EPINEPHrine 0.3 mg/0.3 mL IJ SOAJ injection Inject 0.3 mg into the muscle as needed for anaphylaxis. 07/21/22   Dionne Bucy, MD  ipratropium (ATROVENT) 0.06 % nasal spray Place 2 sprays into both nostrils 4 (four) times daily. 08/12/21   Becky Augusta, NP  predniSONE (STERAPRED UNI-PAK 21 TAB) 10 MG (21) TBPK tablet Take by mouth daily. Stop by the pharmacy to pick up your prescriptions.  Follow up with your primary care provider as needed. 12/15/22   Katha Cabal, DO    Family History Family History  Problem Relation Age of Onset   Diabetes Maternal Grandmother    Healthy Mother    Healthy Father    Breast cancer Neg Hx     Social History Social History   Tobacco  Use   Smoking status: Never   Smokeless tobacco: Never  Vaping Use   Vaping Use: Every day  Substance Use Topics   Alcohol use: Never   Drug use: Never     Allergies   Patient has no known allergies.   Review of Systems Review of Systems: negative unless otherwise stated in HPI.      Physical Exam Triage Vital Signs ED Triage Vitals  Enc Vitals Group     BP 12/15/22 1544 (!) 121/103     Pulse Rate 12/15/22 1544 (!) 110     Resp 12/15/22 1544 14     Temp 12/15/22 1544 98.2 F (36.8 C)     Temp Source 12/15/22 1544 Oral     SpO2 12/15/22 1544 100 %     Weight 12/15/22 1542 274 lb 14.6 oz (124.7 kg)     Height 12/15/22 1542 5\' 2"  (1.575 m)     Head Circumference --      Peak Flow --      Pain Score 12/15/22 1541 0     Pain Loc --      Pain Edu? --      Excl. in GC? --    No data found.  Updated Vital Signs BP (!) 121/103 (BP Location: Left Arm)   Pulse (!)  110   Temp 98.2 F (36.8 C) (Oral)   Resp 14   Ht 5\' 2"  (1.575 m)   Wt 124.7 kg   LMP 12/08/2022 (Approximate)   SpO2 100%   BMI 50.28 kg/m   Visual Acuity Right Eye Distance:   Left Eye Distance:   Bilateral Distance:    Right Eye Near:   Left Eye Near:    Bilateral Near:     Physical Exam GEN:     alert, non-toxic appearing female in no distress    HENT:  mucus membranes moist, oropharyngeal without lesions or erythema, no tonsillar hypertrophy or exudates, no nasal discharge EYES:   pupils equal and reactive, no scleral injection or discharge NECK:  normal ROM  RESP:  no increased work of breathing, faint expiratory wheezing, rhonchi throughout CVS:   regular rhythm, tachycardic Skin:   warm and dry, no rash on visible skin    UC Treatments / Results  Labs (all labs ordered are listed, but only abnormal results are displayed) Labs Reviewed - No data to display  EKG   Radiology No results found.  Procedures Procedures (including critical care time)  Medications Ordered in UC Medications - No data to display  Initial Impression / Assessment and Plan / UC Course  I have reviewed the triage vital signs and the nursing notes.  Pertinent labs & imaging results that were available during my care of the patient were reviewed by me and considered in my medical decision making (see chart for details).       Pt is a 22 y.o. female who presents for 2 weeks of cough that is not improving.  Courteny is  afebrile here without recent antipyretics. Satting well on room air. Overall pt is  non-toxic appearing, well hydrated, without respiratory distress. Pulmonary exam is remarkable for rhonchi that clear with cough and faint expiratory wheezing.  After shared decision making, we will not pursue chest x-ray at this time as it currently would not change management.  COVID  and influenza testing deferred due to length of symptoms.   Treat acute asthmatic bronchitis with  steroids and antibiotics as below.  Tessalon Perles  for cough and albuterol inhaler refilled.  Typical duration of symptoms discussed. Return and ED precautions given and patient voiced understanding.   Discussed MDM, treatment plan and plan for follow-up with patient who agrees with plan.      Final Clinical Impressions(s) / UC Diagnoses   Final diagnoses:  Mild intermittent asthmatic bronchitis with acute exacerbation     Discharge Instructions      Stop by the pharmacy to pick up your prescriptions.  Follow up with your primary care provider as needed.      ED Prescriptions     Medication Sig Dispense Auth. Provider   predniSONE (STERAPRED UNI-PAK 21 TAB) 10 MG (21) TBPK tablet Take by mouth daily. Stop by the pharmacy to pick up your prescriptions.  Follow up with your primary care provider as needed. 21 tablet Eldean Nanna, DO   benzonatate (TESSALON) 100 MG capsule Take 2 capsules (200 mg total) by mouth every 8 (eight) hours. 21 capsule Brinlynn Gorton, DO   albuterol (VENTOLIN HFA) 108 (90 Base) MCG/ACT inhaler Inhale 2 puffs into the lungs every 4 (four) hours as needed. 18 g Capri Raben, DO   amoxicillin-clavulanate (AUGMENTIN) 875-125 MG tablet Take 1 tablet by mouth every 12 (twelve) hours. 14 tablet Saed Hudlow, Seward Meth, DO      PDMP not reviewed this encounter.   Katha Cabal, DO 12/29/22 2350

## 2022-12-15 NOTE — ED Triage Notes (Signed)
Patient c/o cough and chest congestion since 12/01/22.  Patient denies fevers.

## 2023-02-24 ENCOUNTER — Ambulatory Visit
Admission: EM | Admit: 2023-02-24 | Discharge: 2023-02-24 | Disposition: A | Discharge status: Home / Self Care | Payer: Medicaid Other | Attending: Emergency Medicine | Admitting: Emergency Medicine

## 2023-02-24 DIAGNOSIS — N760 Acute vaginitis: Secondary | ICD-10-CM | POA: Insufficient documentation

## 2023-02-24 DIAGNOSIS — N390 Urinary tract infection, site not specified: Secondary | ICD-10-CM | POA: Insufficient documentation

## 2023-02-24 DIAGNOSIS — B9689 Other specified bacterial agents as the cause of diseases classified elsewhere: Secondary | ICD-10-CM | POA: Insufficient documentation

## 2023-02-24 LAB — CBC WITH DIFFERENTIAL/PLATELET
Abs Immature Granulocytes: 0.03 10*3/uL (ref 0.00–0.07)
Basophils Absolute: 0.1 10*3/uL (ref 0.0–0.1)
Basophils Relative: 1 %
Eosinophils Absolute: 0.1 10*3/uL (ref 0.0–0.5)
Eosinophils Relative: 1 %
HCT: 41.5 % (ref 36.0–46.0)
Hemoglobin: 13.7 g/dL (ref 12.0–15.0)
Immature Granulocytes: 0 %
Lymphocytes Relative: 23 %
Lymphs Abs: 2 10*3/uL (ref 0.7–4.0)
MCH: 28 pg (ref 26.0–34.0)
MCHC: 33 g/dL (ref 30.0–36.0)
MCV: 84.9 fL (ref 80.0–100.0)
Monocytes Absolute: 0.4 10*3/uL (ref 0.1–1.0)
Monocytes Relative: 4 %
Neutro Abs: 5.9 10*3/uL (ref 1.7–7.7)
Neutrophils Relative %: 71 %
Platelets: 324 10*3/uL (ref 150–400)
RBC: 4.89 MIL/uL (ref 3.87–5.11)
RDW: 13.9 % (ref 11.5–15.5)
WBC: 8.3 10*3/uL (ref 4.0–10.5)
nRBC: 0 % (ref 0.0–0.2)

## 2023-02-24 LAB — COMPREHENSIVE METABOLIC PANEL
ALT: 42 U/L (ref 0–44)
AST: 22 U/L (ref 15–41)
Albumin: 4.4 g/dL (ref 3.5–5.0)
Alkaline Phosphatase: 75 U/L (ref 38–126)
Anion gap: 10 (ref 5–15)
BUN: 16 mg/dL (ref 6–20)
CO2: 21 mmol/L — ABNORMAL LOW (ref 22–32)
Calcium: 9 mg/dL (ref 8.9–10.3)
Chloride: 105 mmol/L (ref 98–111)
Creatinine, Ser: 0.69 mg/dL (ref 0.44–1.00)
GFR, Estimated: >60 mL/min (ref >60)
Glucose, Bld: 98 mg/dL (ref 70–99)
Potassium: 3.8 mmol/L (ref 3.5–5.1)
Sodium: 136 mmol/L (ref 135–145)
Total Bilirubin: 0.6 mg/dL (ref 0.3–1.2)
Total Protein: 8.1 g/dL (ref 6.5–8.1)

## 2023-02-24 LAB — PREGNANCY, URINE: Preg Test, Ur: NEGATIVE

## 2023-02-24 LAB — URINALYSIS, W/ REFLEX TO CULTURE (INFECTION SUSPECTED)
Glucose, UA: NEGATIVE mg/dL
Nitrite: NEGATIVE
Protein, ur: 30 mg/dL — AB
Specific Gravity, Urine: 1.025 (ref 1.005–1.030)
pH: 6.5 (ref 5.0–8.0)

## 2023-02-24 LAB — WET PREP, GENITAL
Sperm: NONE SEEN
Trich, Wet Prep: NONE SEEN
WBC, Wet Prep HPF POC: >10 — AB (ref <10)
Yeast Wet Prep HPF POC: NONE SEEN

## 2023-02-24 MED ORDER — METRONIDAZOLE 500 MG PO TABS: 500.0000 mg | ORAL_TABLET | Freq: Two times a day (BID) | ORAL | 0 refills | Status: DC

## 2023-02-24 MED ORDER — NITROFURANTOIN MONOHYD MACRO 100 MG PO CAPS: 100.0000 mg | ORAL_CAPSULE | Freq: Two times a day (BID) | ORAL | 0 refills | Status: DC

## 2023-02-24 MED ORDER — ONDANSETRON 8 MG PO TBDP: 8.0000 mg | ORAL_TABLET | Freq: Three times a day (TID) | ORAL | 0 refills | Status: DC | PRN

## 2023-02-24 MED ORDER — PHENAZOPYRIDINE HCL 200 MG PO TABS: 200.0000 mg | ORAL_TABLET | Freq: Three times a day (TID) | ORAL | 0 refills | Status: DC

## 2023-02-24 NOTE — Discharge Instructions (Addendum)
Take the Flagyl (metronidazole) 500 mg twice daily for treatment of your bacterial vaginosis.  Avoid alcohol while on the metronidazole as taken together will cause of vomiting.  Bacterial vaginosis is often caused by a imbalance of bacteria in your vaginal vault.  This is sometimes a result of using tampons or hormonal fluctuations during her menstrual cycle.  You if your symptoms are recurrent you can try using a boric acid suppository twice weekly to help maintain the acid-base balance in your vagina vault which could prevent further infection.  You can also try vaginal probiotics to help return normal bacterial balance.   For your nausea vomiting please use the Zofran.  You can use 1 tablet every 8 hours as needed.  It will dissolve on or under your tongue.  Take the Macrobid twice daily for 5 days with food for treatment of urinary tract infection.  Use the Pyridium every 8 hours as needed for urinary discomfort.  This will turn your urine a bright red-orange.  Increase your oral fluid intake so that you increase your urine production and or flushing your urinary system.  Take an over-the-counter probiotic, such as Culturelle-Align-Activia, 1 hour after each dose of antibiotic to prevent diarrhea or yeast infections from forming.  We will culture urine and change the antibiotics if necessary.  Return for reevaluation, or see your primary care provider, for any new or worsening symptoms.

## 2023-02-24 NOTE — ED Triage Notes (Signed)
Sx started 1 week ago, vomit,nausea,headache, light headed, period 15 days late. Negative test.

## 2023-02-24 NOTE — ED Provider Notes (Signed)
[facility]    CSN: [id number] Arrival date & time: 02/24/23  1314      History   Chief Complaint Chief Complaint  Patient presents with   Emesis   Nausea   Headache    HPI Jennifer Lamb is a 22 y.o. female.   HPI  22 year old female with a past medical history significant for asthma, anxiety, and obesity presents for evaluation of intermittent headache for the last week that is responding to ibuprofen, nausea, vomiting, and diarrhea.  She also reports that she is felt slightly lightheaded.  She is eating and drinking but she does have a decreased appetite.  She also endorses some lower abdominal cramping pain.  She denies any runny nose, nasal congestion, fever, pain with urination, urinary urgency or frequency, vaginal discharge, or vaginal itching.  She is 12 days late on her menstrual cycle.  She has taken a home pregnancy test which was negative.  Past Medical History:  Diagnosis Date   Anxiety    Asthma    History of blood transfusion 10/2018   after Cesarean section 2020   Obese    Placenta previa antepartum in second trimester 07/30/2018   Placental abruption in third trimester 11/12/2018   Pregnancy 09/19/2018   Supervision of normal first teen pregnancy in first trimester 05/03/2018   Vaginal bleeding in pregnancy, third trimester     Patient Active Problem List   Diagnosis Date Noted   Normal labor 05/02/2020   Indication for care in labor or delivery 05/01/2020   Type A blood, Rh positive 04/20/2020   Obesity in pregnancy 10/29/2019   Adult BMI 40.0-44.9 kg/sq m (HCC) 10/29/2019   Supervision of high risk pregnancy, antepartum 10/29/2019   Previous cesarean section complicating pregnancy 10/29/2019    Past Surgical History:  Procedure Laterality Date   CESAREAN SECTION N/A 11/12/2018   Procedure: CESAREAN SECTION;  Surgeon: Hildred Laser, MD;  Location: [facility] ORS;  Service: Obstetrics;  Laterality: N/A;  STAT   TONSILLECTOMY     22 yrs old     OB History     Gravida  2   Para  2   Term  1   Preterm  1   AB      Living  2      SAB      IAB      Ectopic      Multiple  0   Live Births  2            Home Medications    Prior to Admission medications   Medication Sig Start Date End Date Taking? Authorizing Provider  metroNIDAZOLE (FLAGYL) 500 MG tablet Take 1 tablet (500 mg total) by mouth 2 (two) times daily. 02/24/23  Yes Becky Augusta, NP  nitrofurantoin, macrocrystal-monohydrate, (MACROBID) 100 MG capsule Take 1 capsule (100 mg total) by mouth 2 (two) times daily. 02/24/23  Yes Becky Augusta, NP  ondansetron (ZOFRAN-ODT) 8 MG disintegrating tablet Take 1 tablet (8 mg total) by mouth every 8 (eight) hours as needed for nausea or vomiting. 02/24/23  Yes Becky Augusta, NP  phenazopyridine (PYRIDIUM) 200 MG tablet Take 1 tablet (200 mg total) by mouth 3 (three) times daily. 02/24/23  Yes Becky Augusta, NP    Family History Family History  Problem Relation Age of Onset   Diabetes Maternal Grandmother    Healthy Mother    Healthy Father    Breast cancer Neg Hx     Social History Social History  Tobacco Use   Smoking status: Never   Smokeless tobacco: Never  Vaping Use   Vaping status: Every Day  Substance Use Topics   Alcohol use: Never   Drug use: Never     Allergies   Patient has no known allergies.   Review of Systems Review of Systems  Constitutional:  Negative for fever.  HENT:  Negative for congestion, ear pain, rhinorrhea and sore throat.   Respiratory:  Positive for cough.   Gastrointestinal:  Positive for abdominal pain, diarrhea, nausea and vomiting.  Genitourinary:  Negative for dysuria, frequency, hematuria, urgency, vaginal discharge and vaginal pain.     Physical Exam Triage Vital Signs ED Triage Vitals  Encounter Vitals Group     BP      Systolic BP Percentile      Diastolic BP Percentile      Pulse      Resp      Temp      Temp src      SpO2      Weight       Height      Head Circumference      Peak Flow      Pain Score      Pain Loc      Pain Education      Exclude from Growth Chart    No data found.  Updated Vital Signs BP 135/84 (BP Location: Right Arm)   Pulse 89   Temp 98.3 F (36.8 C) (Oral)   Resp 16   Wt 265 lb (120.2 kg)   LMP 01/10/2023 (Approximate)   SpO2 98%   BMI 48.47 kg/m   Visual Acuity Right Eye Distance:   Left Eye Distance:   Bilateral Distance:    Right Eye Near:   Left Eye Near:    Bilateral Near:     Physical Exam Vitals and nursing note reviewed.  Constitutional:      Appearance: Normal appearance. She is not ill-appearing.  HENT:     Head: Normocephalic and atraumatic.  Cardiovascular:     Rate and Rhythm: Normal rate and regular rhythm.     Pulses: Normal pulses.     Heart sounds: Normal heart sounds. No murmur heard.    No friction rub. No gallop.  Pulmonary:     Effort: Pulmonary effort is normal.     Breath sounds: Normal breath sounds. No wheezing, rhonchi or rales.  Abdominal:     General: Abdomen is flat.     Tenderness: There is abdominal tenderness. There is no guarding or rebound.  Skin:    General: Skin is warm and dry.     Capillary Refill: Capillary refill takes less than 2 seconds.     Findings: No rash.  Neurological:     General: No focal deficit present.     Mental Status: She is alert and oriented to person, place, and time.      UC Treatments / Results  Labs (all labs ordered are listed, but only abnormal results are displayed) Labs Reviewed  WET PREP, GENITAL - Abnormal; Notable for the following components:      Result Value   Clue Cells Wet Prep HPF POC PRESENT (*)    WBC, Wet Prep HPF POC >10 (*)    All other components within normal limits  URINALYSIS, W/ REFLEX TO CULTURE (INFECTION SUSPECTED) - Abnormal; Notable for the following components:   APPearance HAZY (*)    Hgb urine dipstick TRACE (*)  Bilirubin Urine SMALL (*)    Ketones, ur TRACE (*)     Protein, ur 30 (*)    Leukocytes,Ua LARGE (*)    Bacteria, UA MANY (*)    All other components within normal limits  URINE CULTURE  CBC WITH DIFFERENTIAL/PLATELET  PREGNANCY, URINE  COMPREHENSIVE METABOLIC PANEL    EKG   Radiology No results found.  Procedures Procedures (including critical care time)  Medications Ordered in UC Medications - No data to display  Initial Impression / Assessment and Plan / UC Course  I have reviewed the triage vital signs and the nursing notes.  Pertinent labs & imaging results that were available during my care of the patient were reviewed by me and considered in my medical decision making (see chart for details).   Patient is a nontoxic-appearing 22 year old female presenting for evaluation of headache and GI symptoms as outlined in HPI above.  Also she is late on her menstrual cycle.  She denies any upper respiratory symptoms but she states she has had a mild infrequent nonproductive cough.  She has taken a home pregnancy test that was negative.  She states that her symptoms began when earlier in the week she had an episode of food poisoning.  She states that she is continue to have the headache, nausea, vomiting, diarrhea, and she also feels shaky.  She is not in any acute distress in the exam room.  Cardiopulmonary exam is benign.  Patient's abdomen is soft and flat with mild suprapubic and adnexal tenderness.  She describes her pain as cramping in her lower abdomen.  Given that she is 12 days late on her menstrual cycle it could be that she is either pregnant, despite having a negative home pregnancy test, or that she is getting ready to have her period.  She is eating and drinking in her vital signs do not reflect that she is dehydrated in any way as she has a normal heart rate of 89 and normal blood pressure 135/84.  Given that she has not had her menstrual cycle and she is typically regular I will order a vaginal wet prep to look for the presence  of BV which could delay her menstrual cycle.  Will also check urinalysis to look for the presence of infection and check urine pregnancy test.  Because of her nausea, vomiting, diarrhea I will also order CBC to look for the presence of infection and a CMP to evaluate patient's electrolytes.  Patient reports that she is not nauseous at this time.  Her headaches have been responsive to ibuprofen and she is not having headache at present.  CBC is unremarkable.  Urine pregnancy test is unremarkable.  Vaginal wet prep shows clue cells but negative for yeast or trichomonas.  Urinalysis shows hazy appearance with trace hemoglobin, small bilirubin, trace ketones, 30 protein, large leukocyte esterase but is negative for nitrites.  Reflex microscopy shows many bacteria with 11-20 RBCs.  I will send urine for culture.  I will discharge patient on the diagnosis of bacterial vaginosis and urinary tract infection.  This is most likely what is contributing to both her headache and her delayed menstrual cycle.  I will treat the bacterial vaginosis with metronidazole 500 mg twice daily for 7 days.  I will prescribe Zofran that she can take every 8 hours as needed for nausea or vomiting.  For the UTI I will treat her with Macrobid 100 g twice daily for 5 days.  Final Clinical Impressions(s) / UC  Diagnoses   Final diagnoses:  Lower urinary tract infectious disease  BV (bacterial vaginosis)     Discharge Instructions      Take the Flagyl (metronidazole) 500 mg twice daily for treatment of your bacterial vaginosis.  Avoid alcohol while on the metronidazole as taken together will cause of vomiting.  Bacterial vaginosis is often caused by a imbalance of bacteria in your vaginal vault.  This is sometimes a result of using tampons or hormonal fluctuations during her menstrual cycle.  You if your symptoms are recurrent you can try using a boric acid suppository twice weekly to help maintain the acid-base balance  in your vagina vault which could prevent further infection.  You can also try vaginal probiotics to help return normal bacterial balance.   For your nausea vomiting please use the Zofran.  You can use 1 tablet every 8 hours as needed.  It will dissolve on or under your tongue.  Take the Macrobid twice daily for 5 days with food for treatment of urinary tract infection.  Use the Pyridium every 8 hours as needed for urinary discomfort.  This will turn your urine a bright red-orange.  Increase your oral fluid intake so that you increase your urine production and or flushing your urinary system.  Take an over-the-counter probiotic, such as Culturelle-Align-Activia, 1 hour after each dose of antibiotic to prevent diarrhea or yeast infections from forming.  We will culture urine and change the antibiotics if necessary.  Return for reevaluation, or see your primary care provider, for any new or worsening symptoms.      ED Prescriptions     Medication Sig Dispense Auth. Provider   nitrofurantoin, macrocrystal-monohydrate, (MACROBID) 100 MG capsule Take 1 capsule (100 mg total) by mouth 2 (two) times daily. 10 capsule Becky Augusta, NP   phenazopyridine (PYRIDIUM) 200 MG tablet Take 1 tablet (200 mg total) by mouth 3 (three) times daily. 6 tablet Becky Augusta, NP   metroNIDAZOLE (FLAGYL) 500 MG tablet Take 1 tablet (500 mg total) by mouth 2 (two) times daily. 14 tablet Becky Augusta, NP   ondansetron (ZOFRAN-ODT) 8 MG disintegrating tablet Take 1 tablet (8 mg total) by mouth every 8 (eight) hours as needed for nausea or vomiting. 20 tablet Becky Augusta, NP      PDMP not reviewed this encounter.   Becky Augusta, NP 02/24/23 1409

## 2023-02-25 LAB — URINE CULTURE: Special Requests: NORMAL

## 2023-06-04 ENCOUNTER — Ambulatory Visit
Admission: EM | Admit: 2023-06-04 | Discharge: 2023-06-04 | Disposition: A | Discharge status: Home / Self Care | Payer: Medicaid Other | Attending: Emergency Medicine | Admitting: Emergency Medicine

## 2023-06-04 DIAGNOSIS — R059 Cough, unspecified: Secondary | ICD-10-CM | POA: Diagnosis present

## 2023-06-04 DIAGNOSIS — Z8709 Personal history of other diseases of the respiratory system: Secondary | ICD-10-CM | POA: Diagnosis not present

## 2023-06-04 DIAGNOSIS — B9789 Other viral agents as the cause of diseases classified elsewhere: Secondary | ICD-10-CM | POA: Diagnosis not present

## 2023-06-04 DIAGNOSIS — F17290 Nicotine dependence, other tobacco product, uncomplicated: Secondary | ICD-10-CM | POA: Insufficient documentation

## 2023-06-04 DIAGNOSIS — Z1152 Encounter for screening for COVID-19: Secondary | ICD-10-CM | POA: Diagnosis not present

## 2023-06-04 DIAGNOSIS — J069 Acute upper respiratory infection, unspecified: Secondary | ICD-10-CM | POA: Diagnosis not present

## 2023-06-04 LAB — RESP PANEL BY RT-PCR (FLU A&B, COVID) ARPGX2
Influenza A by PCR: NEGATIVE
Influenza B by PCR: NEGATIVE
SARS Coronavirus 2 by RT PCR: NEGATIVE

## 2023-06-04 MED ORDER — ALBUTEROL SULFATE HFA 108 (90 BASE) MCG/ACT IN AERS: 2.0000 | INHALATION_SPRAY | RESPIRATORY_TRACT | 0 refills | Status: DC | PRN

## 2023-06-04 NOTE — ED Triage Notes (Signed)
Sx started 10-31  Cough- chest pain under breast-sob- nasal congestion Hx of asthma  Patient states that people in her home are sick.

## 2023-06-04 NOTE — Discharge Instructions (Signed)
Check MyChart for results.  May take over-the-counter meds as directed, use albuterol as directed, take daily allergy medicine of your choice.  Push fluids.  Return as needed

## 2023-06-04 NOTE — ED Provider Notes (Signed)
[facility]    CSN: [id number] Arrival date & time: 06/04/23  1018      History   Chief Complaint Chief Complaint  Patient presents with   Headache   Back Pain    HPI Jennifer Lamb is a 22 y.o. female.   22 year old female, Jennifer Lamb, presents to urgent care for evaluation of cough, chest pain under left breast, shortness of breath, nasal congestion that started 05/31/23,  patient states she has a history of asthma and people in her home are sick. Pt is out of her inhaler.  Patient requesting COVID and flu testing and work note.  The history is provided by the patient. No language interpreter was used.    Past Medical History:  Diagnosis Date   Anxiety    Asthma    History of blood transfusion 10/2018   after Cesarean section 2020   Obese    Placenta previa antepartum in second trimester 07/30/2018   Placental abruption in third trimester 11/12/2018   Pregnancy 09/19/2018   Supervision of normal first teen pregnancy in first trimester 05/03/2018   Vaginal bleeding in pregnancy, third trimester     Patient Active Problem List   Diagnosis Date Noted   Viral URI with cough 06/04/2023   History of asthma 06/04/2023   Normal labor 05/02/2020   Indication for care in labor or delivery 05/01/2020   Type A blood, Rh positive 04/20/2020   Obesity in pregnancy 10/29/2019   Adult BMI 40.0-44.9 kg/sq m (HCC) 10/29/2019   Supervision of high risk pregnancy, antepartum 10/29/2019   Previous cesarean section complicating pregnancy 10/29/2019    Past Surgical History:  Procedure Laterality Date   CESAREAN SECTION N/A 11/12/2018   Procedure: CESAREAN SECTION;  Surgeon: Hildred Laser, MD;  Location: [facility] ORS;  Service: Obstetrics;  Laterality: N/A;  STAT   TONSILLECTOMY     22 yrs old    OB History     Gravida  2   Para  2   Term  1   Preterm  1   AB      Living  2      SAB      IAB      Ectopic      Multiple  0   Live Births  2             Home Medications    Prior to Admission medications   Medication Sig Start Date End Date Taking? Authorizing Provider  albuterol (VENTOLIN HFA) 108 (90 Base) MCG/ACT inhaler Inhale 2 puffs into the lungs every 4 (four) hours as needed for wheezing or shortness of breath. 06/04/23   Jashley Yellin, Para March, NP  metroNIDAZOLE (FLAGYL) 500 MG tablet Take 1 tablet (500 mg total) by mouth 2 (two) times daily. 02/24/23   Becky Augusta, NP  nitrofurantoin, macrocrystal-monohydrate, (MACROBID) 100 MG capsule Take 1 capsule (100 mg total) by mouth 2 (two) times daily. 02/24/23   Becky Augusta, NP  ondansetron (ZOFRAN-ODT) 8 MG disintegrating tablet Take 1 tablet (8 mg total) by mouth every 8 (eight) hours as needed for nausea or vomiting. 02/24/23   Becky Augusta, NP  phenazopyridine (PYRIDIUM) 200 MG tablet Take 1 tablet (200 mg total) by mouth 3 (three) times daily. 02/24/23   Becky Augusta, NP    Family History Family History  Problem Relation Age of Onset   Diabetes Maternal Grandmother    Healthy Mother    Healthy Father    Breast cancer Neg Hx  Social History Social History   Tobacco Use   Smoking status: Never   Smokeless tobacco: Never  Vaping Use   Vaping status: Every Day  Substance Use Topics   Alcohol use: Never   Drug use: Never     Allergies   Patient has no known allergies.   Review of Systems Review of Systems  Constitutional:  Positive for chills.  HENT:  Positive for congestion.   Respiratory:  Positive for cough. Negative for shortness of breath, wheezing and stridor.   All other systems reviewed and are negative.    Physical Exam Triage Vital Signs ED Triage Vitals  Encounter Vitals Group     BP 06/04/23 1146 (!) 165/88     Systolic BP Percentile --      Diastolic BP Percentile --      Pulse Rate 06/04/23 1146 99     Resp 06/04/23 1146 18     Temp 06/04/23 1146 98.3 F (36.8 C)     Temp Source 06/04/23 1146 Oral     SpO2 06/04/23 1146 100 %      Weight --      Height --      Head Circumference --      Peak Flow --      Pain Score 06/04/23 1145 0     Pain Loc --      Pain Education --      Exclude from Growth Chart --    No data found.  Updated Vital Signs BP (!) 165/88 (BP Location: Right Wrist)   Pulse 99   Temp 98.3 F (36.8 C) (Oral)   Resp 18   LMP 05/21/2023 (Approximate)   SpO2 100%   Visual Acuity Right Eye Distance:   Left Eye Distance:   Bilateral Distance:    Right Eye Near:   Left Eye Near:    Bilateral Near:     Physical Exam Vitals and nursing note reviewed.  Constitutional:      Appearance: Normal appearance. She is well-developed. She is obese.  HENT:     Head: Normocephalic.     Right Ear: Tympanic membrane is retracted.     Left Ear: Tympanic membrane is retracted.     Nose: Mucosal edema and congestion present.     Right Sinus: No maxillary sinus tenderness or frontal sinus tenderness.     Left Sinus: No maxillary sinus tenderness or frontal sinus tenderness.     Mouth/Throat:     Lips: Pink.     Mouth: Mucous membranes are moist.     Pharynx: Oropharynx is clear. Uvula midline.  Eyes:     General: Lids are normal.     Pupils: Pupils are equal, round, and reactive to light.  Neck:     Trachea: Trachea normal.  Cardiovascular:     Rate and Rhythm: Normal rate and regular rhythm.     Pulses: Normal pulses.     Heart sounds: Normal heart sounds.  Pulmonary:     Effort: Pulmonary effort is normal.     Breath sounds: Normal breath sounds and air entry.  Musculoskeletal:     Cervical back: Normal range of motion.  Neurological:     General: No focal deficit present.     Mental Status: She is alert and oriented to person, place, and time.     GCS: GCS eye subscore is 4. GCS verbal subscore is 5. GCS motor subscore is 6.     Cranial Nerves: No cranial nerve  deficit.     Sensory: No sensory deficit.  Psychiatric:        Attention and Perception: Attention normal.        Mood and  Affect: Mood normal.        Speech: Speech normal.        Behavior: Behavior normal. Behavior is cooperative.      UC Treatments / Results  Labs (all labs ordered are listed, but only abnormal results are displayed) Labs Reviewed  RESP PANEL BY RT-PCR (FLU A&B, COVID) ARPGX2    EKG   Radiology No results found.  Procedures Procedures (including critical care time)  Medications Ordered in UC Medications - No data to display  Initial Impression / Assessment and Plan / UC Course  I have reviewed the triage vital signs and the nursing notes.  Pertinent labs & imaging results that were available during my care of the patient were reviewed by me and considered in my medical decision making (see chart for details).     Ddx: Viral URI with cough, history of asthma, allergies Final Clinical Impressions(s) / UC Diagnoses   Final diagnoses:  Viral URI with cough  History of asthma     Discharge Instructions      Check MyChart for results.  May take over-the-counter meds as directed, use albuterol as directed, take daily allergy medicine of your choice.  Push fluids.  Return as needed     ED Prescriptions     Medication Sig Dispense Auth. Provider   albuterol (VENTOLIN HFA) 108 (90 Base) MCG/ACT inhaler  (Status: Discontinued) Inhale 2 puffs into the lungs every 4 (four) hours as needed for wheezing or shortness of breath. 1 each Tell Rozelle, Para March, NP   albuterol (VENTOLIN HFA) 108 (90 Base) MCG/ACT inhaler Inhale 2 puffs into the lungs every 4 (four) hours as needed for wheezing or shortness of breath. 1 each Ubah Radke, Para March, NP      PDMP not reviewed this encounter.   Clancy Gourd, NP 06/04/23 1444

## 2023-06-12 ENCOUNTER — Ambulatory Visit (INDEPENDENT_AMBULATORY_CARE_PROVIDER_SITE_OTHER): Payer: Medicaid Other | Admitting: Certified Nurse Midwife

## 2023-06-12 ENCOUNTER — Other Ambulatory Visit (HOSPITAL_COMMUNITY)
Admission: RE | Admit: 2023-06-12 | Discharge: 2023-06-12 | Disposition: A | Discharge status: Home / Self Care | Payer: Medicaid Other | Source: Ambulatory Visit | Attending: Certified Nurse Midwife | Admitting: Certified Nurse Midwife

## 2023-06-12 ENCOUNTER — Encounter: Payer: Self-pay | Admitting: Certified Nurse Midwife

## 2023-06-12 VITALS — BP 125/75 | HR 99 | Ht 62.0 in | Wt 271.3 lb

## 2023-06-12 DIAGNOSIS — Z124 Encounter for screening for malignant neoplasm of cervix: Secondary | ICD-10-CM | POA: Diagnosis not present

## 2023-06-12 DIAGNOSIS — Z01419 Encounter for gynecological examination (general) (routine) without abnormal findings: Secondary | ICD-10-CM

## 2023-06-12 DIAGNOSIS — N939 Abnormal uterine and vaginal bleeding, unspecified: Secondary | ICD-10-CM | POA: Diagnosis not present

## 2023-06-12 DIAGNOSIS — Z13220 Encounter for screening for lipoid disorders: Secondary | ICD-10-CM

## 2023-06-12 DIAGNOSIS — Z113 Encounter for screening for infections with a predominantly sexual mode of transmission: Secondary | ICD-10-CM | POA: Insufficient documentation

## 2023-06-12 NOTE — Progress Notes (Signed)
GYNECOLOGY ANNUAL PREVENTATIVE CARE ENCOUNTER NOTE  History:     Jennifer Lamb is a 22 y.o. G70P1102 female here for a routine annual gynecologic exam.  Current complaints: abnormal uterine bleeding, skips periods , sometimes they are early more often late. .   Denies abnormal vaginal bleeding, discharge, pelvic pain, problems with intercourse or other gynecologic concerns.     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 Contraception: none Last Pap: has not had.  Last mammogram:  n/a    Upstream - 06/12/23 1340       Pregnancy Intention Screening   Does the patient want to become pregnant in the next year? Yes    Does the patient's partner want to become pregnant in the next year? Yes    Would the patient like to discuss contraceptive options today? No            The pregnancy intention screening data noted above was reviewed. Potential methods of contraception were discussed. The patient elected to proceed with No data recorded.    Obstetric History OB History  Gravida Para Term Preterm AB Living  2 2 1 1   2   SAB IAB Ectopic Multiple Live Births        0 2    # Outcome Date GA Lbr Len/2nd Weight Sex Type Anes PTL Lv  2 Term 05/02/20 [redacted]w[redacted]d 06:00 / 00:36 6 lb 4.2 oz (2.84 kg) M Vag-Spont EPI  LIV  1 Preterm 11/12/18 [redacted]w[redacted]d  4 lb 9.4 oz (2.08 kg) M  Gen  LIV    Past Medical History:  Diagnosis Date   Anxiety    Asthma    History of blood transfusion 10/2018   after Cesarean section 2020   Obese    Placenta previa antepartum in second trimester 07/30/2018   Placental abruption in third trimester 11/12/2018   Pregnancy 09/19/2018   Supervision of normal first teen pregnancy in first trimester 05/03/2018   Vaginal bleeding in pregnancy, third  trimester     Past Surgical History:  Procedure Laterality Date   CESAREAN SECTION N/A 11/12/2018   Procedure: CESAREAN SECTION;  Surgeon: Hildred Laser, MD;  Location: [facility] ORS;  Service: Obstetrics;  Laterality: N/A;  STAT   TONSILLECTOMY     22 yrs old    Current Outpatient Medications on File Prior to Visit  Medication Sig Dispense Refill   albuterol (VENTOLIN HFA) 108 (90 Base) MCG/ACT inhaler Inhale 2 puffs into the lungs every 4 (four) hours as needed for wheezing or shortness of breath. 1 each 0   metroNIDAZOLE (FLAGYL) 500 MG tablet Take 1 tablet (500 mg total) by mouth 2 (two) times daily. 14 tablet 0   nitrofurantoin, macrocrystal-monohydrate, (MACROBID) 100 MG capsule Take 1 capsule (100 mg total) by mouth 2 (two) times daily. 10 capsule 0   ondansetron (ZOFRAN-ODT) 8 MG disintegrating tablet Take 1 tablet (8 mg total) by mouth every 8 (eight) hours as needed for nausea or vomiting. 20 tablet 0   phenazopyridine (PYRIDIUM) 200 MG tablet Take 1 tablet (200 mg total) by mouth 3 (three) times daily. 6 tablet 0   No current facility-administered medications on file prior to visit.    No Known Allergies  Social History:  reports that she has never smoked. She has never  used smokeless tobacco. She reports that she does not drink alcohol and does not use drugs.  Family History  Problem Relation Age of Onset   Diabetes Maternal Grandmother    Healthy Mother    Healthy Father    Breast cancer Neg Hx     The following portions of the patient's history were reviewed and updated as appropriate: allergies, current medications, past family history, past medical history, past social history, past surgical history and problem list.  Review of Systems Pertinent items noted in HPI and remainder of comprehensive ROS otherwise negative.  Physical Exam:  BP 125/75   Pulse 99   Ht 5\' 2"  (1.575 m) Comment: patient reports  Wt 271 lb 4.8 oz (123.1 kg)   LMP 05/21/2023 (Approximate)    BMI 49.62 kg/m  CONSTITUTIONAL: Well-developed, well-nourished female in no acute distress.  HENT:  Normocephalic, atraumatic, External right and left ear normal. Oropharynx is clear and moist EYES: Conjunctivae and EOM are normal. Pupils are equal, round, and reactive to light. No scleral icterus.  NECK: Normal range of motion, supple, no masses.  Normal thyroid.  SKIN: Skin is warm and dry. No rash noted. Not diaphoretic. No erythema. No pallor. MUSCULOSKELETAL: Normal range of motion. No tenderness.  No cyanosis, clubbing, or edema.  2+ distal pulses. NEUROLOGIC: Alert and oriented to person, place, and time. Normal reflexes, muscle tone coordination.  PSYCHIATRIC: Normal mood and affect. Normal behavior. Normal judgment and thought content. CARDIOVASCULAR: Normal heart rate noted, regular rhythm RESPIRATORY: Clear to auscultation bilaterally. Effort and breath sounds normal, no problems with respiration noted. BREASTS: Symmetric in size. No masses, tenderness, skin changes, nipple drainage, or lymphadenopathy bilaterally.  ABDOMEN: Soft, no distention noted.  No tenderness, rebound or guarding.  PELVIC: Normal appearing external genitalia and urethral meatus; normal appearing vaginal mucosa and cervix.  No abnormal discharge noted.  Pap smear obtained. Contact bleeding Normal uterine size, no other palpable masses, no uterine or adnexal tenderness.  .   Assessment and Plan:    1. Women's annual routine gynecological examination  Pap: Will follow up results of pap smear and manage accordingly. Mammogram : n/a  Labs: Lipid , TSH, testosterone, prolactin FSH/LH. U/s evaluate PCOS.  Refills: none Referral: none Encouraged weight loss for future pregnancy and to help regulate cycle. Routine preventative health maintenance measures emphasized. Please refer to After Visit Summary for other counseling recommendations.      Doreene Burke, CNM [facility]  [facility],   [facility]

## 2023-06-12 NOTE — Patient Instructions (Signed)

## 2023-06-13 ENCOUNTER — Other Ambulatory Visit: Payer: Medicaid Other

## 2023-06-13 LAB — LIPID PANEL
Chol/HDL Ratio: 4.2 ratio (ref 0.0–4.4)
Cholesterol, Total: 178 mg/dL (ref 100–199)
HDL: 42 mg/dL (ref >39)
LDL Chol Calc (NIH): 122 mg/dL — ABNORMAL HIGH (ref 0–99)
Triglycerides: 76 mg/dL (ref 0–149)
VLDL Cholesterol Cal: 14 mg/dL (ref 5–40)

## 2023-06-13 LAB — FSH/LH
FSH: 5.4 m[IU]/mL
LH: 6.3 m[IU]/mL

## 2023-06-13 LAB — TSH: TSH: 3.13 u[IU]/mL (ref 0.450–4.500)

## 2023-06-13 LAB — TESTOSTERONE: Testosterone: 30 ng/dL (ref 13–71)

## 2023-06-13 LAB — PROLACTIN: Prolactin: 6.5 ng/mL (ref 4.8–33.4)

## 2023-06-14 ENCOUNTER — Encounter: Payer: Self-pay | Admitting: Certified Nurse Midwife

## 2023-06-14 LAB — CERVICOVAGINAL ANCILLARY ONLY
Bacterial Vaginitis (gardnerella): NEGATIVE
Candida Glabrata: NEGATIVE
Candida Vaginitis: NEGATIVE
Chlamydia: NEGATIVE
Comment: NEGATIVE
Comment: NEGATIVE
Comment: NEGATIVE
Comment: NEGATIVE
Comment: NEGATIVE
Comment: NORMAL
Neisseria Gonorrhea: NEGATIVE
Trichomonas: NEGATIVE

## 2023-06-14 LAB — CYTOLOGY - PAP
Comment: NEGATIVE
Diagnosis: NEGATIVE
High risk HPV: NEGATIVE

## 2023-06-27 ENCOUNTER — Ambulatory Visit (INDEPENDENT_AMBULATORY_CARE_PROVIDER_SITE_OTHER): Payer: Medicaid Other | Admitting: Family Medicine

## 2023-06-27 DIAGNOSIS — Z91199 Patient's noncompliance with other medical treatment and regimen due to unspecified reason: Secondary | ICD-10-CM

## 2023-06-27 NOTE — Progress Notes (Signed)
Patient was not seen for appt d/t no call, no show, or late arrival >10 mins past appt time.   Elise T Payne, FNP  [facility] [address] [place], [place] [address] [phone] (phone) [phone] (fax) [facility]  

## 2023-07-10 ENCOUNTER — Other Ambulatory Visit: Payer: Medicaid Other

## 2023-07-10 ENCOUNTER — Telehealth: Payer: Self-pay | Admitting: Certified Nurse Midwife

## 2023-07-10 NOTE — Telephone Encounter (Signed)
Reached out to pt about gyn [place] that was scheduled on 07/10/2023 at 2:00 (per [place]).  Left message for pt to call back.

## 2023-07-11 ENCOUNTER — Encounter: Payer: Self-pay | Admitting: Certified Nurse Midwife

## 2023-07-11 NOTE — Telephone Encounter (Signed)
Reached out to pt (2x) about gyn [place] that was scheduled on 07/10/2023 at 2:00 (per [place]).  Left message for pt to call back.  Will send a MyChart letter to pt.

## 2023-07-12 ENCOUNTER — Encounter: Payer: Self-pay | Admitting: Certified Nurse Midwife

## 2023-07-27 ENCOUNTER — Ambulatory Visit: Payer: Medicaid Other | Admitting: Family Medicine

## 2023-08-01 HISTORY — PX: OTHER SURGICAL HISTORY: SHX169

## 2023-08-07 ENCOUNTER — Other Ambulatory Visit: Payer: Medicaid Other

## 2023-08-07 ENCOUNTER — Telehealth: Payer: Self-pay | Admitting: Certified Nurse Midwife

## 2023-08-07 NOTE — Telephone Encounter (Signed)
 Reached out to pt about the gyn [place] that was scheduled on 08/07/2023 at 9:14 at [facility].  Left message for pt to call back.

## 2023-08-08 ENCOUNTER — Encounter: Payer: Self-pay | Admitting: Certified Nurse Midwife

## 2023-08-08 NOTE — Telephone Encounter (Signed)
 Reached out to pt (2x) about the gyn [place] that was scheduled on 17/2025 at 9:15 at [facility].  Left message for pt to call back.  Will send a MyChart letter.

## 2023-09-10 DIAGNOSIS — N23 Unspecified renal colic: Secondary | ICD-10-CM | POA: Diagnosis not present

## 2023-09-10 DIAGNOSIS — N132 Hydronephrosis with renal and ureteral calculous obstruction: Secondary | ICD-10-CM | POA: Diagnosis not present

## 2023-09-10 DIAGNOSIS — R1031 Right lower quadrant pain: Secondary | ICD-10-CM | POA: Diagnosis not present

## 2023-09-11 DIAGNOSIS — R1031 Right lower quadrant pain: Secondary | ICD-10-CM | POA: Diagnosis not present

## 2023-09-11 DIAGNOSIS — N132 Hydronephrosis with renal and ureteral calculous obstruction: Secondary | ICD-10-CM | POA: Diagnosis not present

## 2023-09-12 DIAGNOSIS — N138 Other obstructive and reflux uropathy: Secondary | ICD-10-CM | POA: Insufficient documentation

## 2023-09-13 DIAGNOSIS — N1330 Unspecified hydronephrosis: Secondary | ICD-10-CM | POA: Diagnosis not present

## 2023-09-13 DIAGNOSIS — N201 Calculus of ureter: Secondary | ICD-10-CM | POA: Diagnosis not present

## 2023-09-13 DIAGNOSIS — J45909 Unspecified asthma, uncomplicated: Secondary | ICD-10-CM | POA: Diagnosis not present

## 2023-09-13 DIAGNOSIS — N390 Urinary tract infection, site not specified: Secondary | ICD-10-CM | POA: Diagnosis not present

## 2023-09-13 DIAGNOSIS — N134 Hydroureter: Secondary | ICD-10-CM | POA: Diagnosis not present

## 2023-09-13 DIAGNOSIS — N200 Calculus of kidney: Secondary | ICD-10-CM | POA: Diagnosis not present

## 2023-09-13 DIAGNOSIS — N138 Other obstructive and reflux uropathy: Secondary | ICD-10-CM | POA: Diagnosis not present

## 2023-09-13 DIAGNOSIS — Z466 Encounter for fitting and adjustment of urinary device: Secondary | ICD-10-CM | POA: Diagnosis not present

## 2023-09-13 DIAGNOSIS — N132 Hydronephrosis with renal and ureteral calculous obstruction: Secondary | ICD-10-CM | POA: Diagnosis not present

## 2023-09-14 DIAGNOSIS — N201 Calculus of ureter: Secondary | ICD-10-CM | POA: Diagnosis not present

## 2023-09-14 DIAGNOSIS — N390 Urinary tract infection, site not specified: Secondary | ICD-10-CM | POA: Diagnosis not present

## 2023-09-14 DIAGNOSIS — N138 Other obstructive and reflux uropathy: Secondary | ICD-10-CM | POA: Diagnosis not present

## 2023-10-18 DIAGNOSIS — N201 Calculus of ureter: Secondary | ICD-10-CM | POA: Diagnosis not present

## 2023-10-18 DIAGNOSIS — Z3189 Encounter for other procreative management: Secondary | ICD-10-CM | POA: Diagnosis not present

## 2023-10-23 ENCOUNTER — Ambulatory Visit: Admitting: Family Medicine

## 2023-10-25 DIAGNOSIS — J45909 Unspecified asthma, uncomplicated: Secondary | ICD-10-CM | POA: Diagnosis not present

## 2023-10-25 DIAGNOSIS — N201 Calculus of ureter: Secondary | ICD-10-CM | POA: Diagnosis not present

## 2023-10-31 ENCOUNTER — Ambulatory Visit: Admitting: Family Medicine

## 2023-11-19 ENCOUNTER — Encounter: Payer: Self-pay | Admitting: Physician Assistant

## 2023-11-19 ENCOUNTER — Ambulatory Visit
Admission: RE | Admit: 2023-11-19 | Discharge: 2023-11-19 | Disposition: A | Discharge status: Home / Self Care | Source: Ambulatory Visit | Attending: Physician Assistant | Admitting: Physician Assistant

## 2023-11-19 ENCOUNTER — Ambulatory Visit (INDEPENDENT_AMBULATORY_CARE_PROVIDER_SITE_OTHER)

## 2023-11-19 VITALS — BP 121/83 | HR 100 | Temp 98.7°F | Resp 18

## 2023-11-19 DIAGNOSIS — J209 Acute bronchitis, unspecified: Secondary | ICD-10-CM | POA: Diagnosis not present

## 2023-11-19 DIAGNOSIS — R051 Acute cough: Secondary | ICD-10-CM | POA: Diagnosis not present

## 2023-11-19 DIAGNOSIS — R059 Cough, unspecified: Secondary | ICD-10-CM | POA: Diagnosis not present

## 2023-11-19 DIAGNOSIS — R509 Fever, unspecified: Secondary | ICD-10-CM | POA: Diagnosis not present

## 2023-11-19 DIAGNOSIS — J45901 Unspecified asthma with (acute) exacerbation: Secondary | ICD-10-CM | POA: Diagnosis not present

## 2023-11-19 LAB — RESP PANEL BY RT-PCR (FLU A&B, COVID) ARPGX2
Influenza A by PCR: NEGATIVE
Influenza B by PCR: NEGATIVE
SARS Coronavirus 2 by RT PCR: NEGATIVE

## 2023-11-19 MED ORDER — PREDNISONE 20 MG PO TABS: 40.0000 mg | ORAL_TABLET | Freq: Every day | ORAL | 0 refills | Status: AC

## 2023-11-19 MED ORDER — PROMETHAZINE-DM 6.25-15 MG/5ML PO SYRP: 5.0000 mL | ORAL_SOLUTION | Freq: Four times a day (QID) | ORAL | 0 refills | Status: DC | PRN

## 2023-11-19 MED ORDER — ALBUTEROL SULFATE HFA 108 (90 BASE) MCG/ACT IN AERS: 1.0000 | INHALATION_SPRAY | Freq: Four times a day (QID) | RESPIRATORY_TRACT | 0 refills | Status: DC | PRN

## 2023-11-19 NOTE — Discharge Instructions (Addendum)
-   You are negative for COVID and flu. - Results of x-ray are pending.  If x-ray shows pneumonia you will be contacted and antibiotics will be sent. -If you do not not hear from us , presentation more consistent with viral illness and it is good sign that you have broken the fever. - I sent cough medicine and also an inhaler and prednisone  for asthma flareup. - Increase rest and fluids.  If fever returns or you begin feeling worse please consider reevaluation. - Cough related to bronchitis can last for couple weeks.

## 2023-11-19 NOTE — ED Triage Notes (Signed)
 Pt presents with a fever,headache, and bodyaches x 3 days. She also has a cough x 1 week.

## 2023-11-19 NOTE — ED Provider Notes (Signed)
 [facility]    CSN: [id number] Arrival date & time: 11/19/23  0917      History   Chief Complaint Chief Complaint  Patient presents with   Generalized Body Aches    Feeling sick with fever - Entered by patient   Cough   Fever    HPI Jennifer Lamb is a 23 y.o. female presenting for approximately 1 week history of dry cough.  Patient says over the past 3 days she developed fever up to 102 degrees with fatigue, chills, sweats, body aches and productive cough as well as shortness of breath.  History of asthma but has not had an inhaler.  Patient says she has not had a fever today.  Current temperature 98.7 degrees and has not taken any antipyretics.  She still reports feeling badly.  Report sore throat only when she coughs.  Denies any sick contacts.  No other complaints.  HPI  Past Medical History:  Diagnosis Date   Anxiety    Asthma    History of blood transfusion 10/2018   after Cesarean section 2020   Obese    Placenta previa antepartum in second trimester 07/30/2018   Placental abruption in third trimester 11/12/2018   Pregnancy 09/19/2018   Supervision of normal first teen pregnancy in first trimester 05/03/2018   Vaginal bleeding in pregnancy, third trimester     Patient Active Problem List   Diagnosis Date Noted   Encounter for fertility planning 10/18/2023   Ureterolithiasis 09/13/2023   Urinary tract obstruction due to kidney stone 09/12/2023   History of asthma 06/04/2023   Contact with and (suspected) exposure to covid-19 05/15/2020   Adult BMI 40.0-44.9 kg/sq m (HCC) 10/29/2019   Pregnancy of unknown anatomic location 08/30/2019    Past Surgical History:  Procedure Laterality Date   CESAREAN SECTION N/A 11/12/2018   Procedure: CESAREAN SECTION;  Surgeon: Teresa Fender, MD;  Location: [facility] ORS;  Service: Obstetrics;  Laterality: N/A;  STAT   TONSILLECTOMY     23 yrs old    OB History     Gravida  2   Para  2   Term  1   Preterm  1    AB      Living  2      SAB      IAB      Ectopic      Multiple  0   Live Births  2            Home Medications    Prior to Admission medications   Medication Sig Start Date End Date Taking? Authorizing Provider  albuterol  (VENTOLIN  HFA) 108 (90 Base) MCG/ACT inhaler Inhale 1-2 puffs into the lungs every 6 (six) hours as needed for wheezing or shortness of breath. 11/19/23  Yes Nancy Axon B, PA-C  predniSONE  (DELTASONE ) 20 MG tablet Take 2 tablets (40 mg total) by mouth daily for 5 days. 11/19/23 11/24/23 Yes Nancy Axon B, PA-C  promethazine -dextromethorphan (PROMETHAZINE -DM) 6.25-15 MG/5ML syrup Take 5 mLs by mouth 4 (four) times daily as needed. 11/19/23  Yes Floydene Hy, PA-C  albuterol  (VENTOLIN  HFA) 108 (90 Base) MCG/ACT inhaler Inhale 2 puffs into the lungs every 4 (four) hours as needed for wheezing or shortness of breath. 06/04/23   Defelice, Eveleen Hinds, NP    Family History Family History  Problem Relation Age of Onset   Diabetes Maternal Grandmother    Healthy Mother    Healthy Father    Breast cancer Neg  Hx     Social History Social History   Tobacco Use   Smoking status: Never   Smokeless tobacco: Never  Vaping Use   Vaping status: Former  Substance Use Topics   Alcohol use: Never   Drug use: Never     Allergies   Patient has no known allergies.   Review of Systems Review of Systems  Constitutional:  Positive for chills, diaphoresis, fatigue and fever.  HENT:  Positive for congestion, rhinorrhea and sore throat. Negative for ear pain, sinus pressure and sinus pain.   Respiratory:  Positive for cough and shortness of breath.   Cardiovascular:  Negative for chest pain.  Gastrointestinal:  Negative for abdominal pain, nausea and vomiting.  Musculoskeletal:  Positive for myalgias.  Skin:  Negative for rash.  Neurological:  Positive for headaches. Negative for weakness.  Hematological:  Negative for adenopathy.     Physical  Exam Triage Vital Signs ED Triage Vitals  Encounter Vitals Group     BP      Systolic BP Percentile      Diastolic BP Percentile      Pulse      Resp      Temp      Temp src      SpO2      Weight      Height      Head Circumference      Peak Flow      Pain Score      Pain Loc      Pain Education      Exclude from Growth Chart    No data found.  Updated Vital Signs BP 121/83 (BP Location: Right Wrist)   Pulse 100   Temp 98.7 F (37.1 C) (Oral)   Resp 18   LMP 11/16/2023 (Approximate)   SpO2 97%   Physical Exam Vitals and nursing note reviewed.  Constitutional:      General: She is not in acute distress.    Appearance: Normal appearance. She is not ill-appearing or toxic-appearing.  HENT:     Head: Normocephalic and atraumatic.     Right Ear: Tympanic membrane, ear canal and external ear normal.     Left Ear: Tympanic membrane, ear canal and external ear normal.     Nose: Congestion present.     Mouth/Throat:     Mouth: Mucous membranes are moist.     Pharynx: Oropharynx is clear.  Eyes:     General: No scleral icterus.       Right eye: No discharge.        Left eye: No discharge.     Conjunctiva/sclera: Conjunctivae normal.  Cardiovascular:     Rate and Rhythm: Normal rate and regular rhythm.     Heart sounds: Normal heart sounds.  Pulmonary:     Effort: Pulmonary effort is normal. No respiratory distress.     Breath sounds: Normal breath sounds.  Musculoskeletal:     Cervical back: Neck supple.  Skin:    General: Skin is dry.  Neurological:     General: No focal deficit present.     Mental Status: She is alert. Mental status is at baseline.     Motor: No weakness.     Gait: Gait normal.  Psychiatric:        Mood and Affect: Mood normal.        Behavior: Behavior normal.      UC Treatments / Results  Labs (all labs ordered are listed,  but only abnormal results are displayed) Labs Reviewed  RESP PANEL BY RT-PCR (FLU A&B, COVID) ARPGX2     EKG   Radiology DG Chest 2 View Result Date: 11/19/2023 CLINICAL DATA:  Cough for the past week and fever for the past 3 days. EXAM: CHEST - 2 VIEW COMPARISON:  10/13/2018.  Chest CTA dated 09/20/2019. FINDINGS: The heart size and mediastinal contours are within normal limits. Both lungs are clear. The visualized skeletal structures are unremarkable. IMPRESSION: No active cardiopulmonary disease. Electronically Signed   By: Catherin Closs M.D.   On: 11/19/2023 12:08    Procedures Procedures (including critical care time)  Medications Ordered in UC Medications - No data to display  Initial Impression / Assessment and Plan / UC Course  I have reviewed the triage vital signs and the nursing notes.  Pertinent labs & imaging results that were available during my care of the patient were reviewed by me and considered in my medical decision making (see chart for details).   24 year old female presents for 3-day history of fever, fatigue, chills, sweats, body aches, headaches and productive cough.  Had a dry cough for approximately the past 1 week.  Vitals are all stable and normal.  Overall well-appearing but coughs frequently.  On exam has nasal congestion.  Throat is clear.  Chest clear.  Heart regular rhythm but mildly tachycardic.  Respiratory panel and chest x-ray obtained.  Respiratory panel negative.  Reviewed result patient.  Wet read chest x-ray negative.  Advised patient should be contacted if x-ray shows acute abnormality and antibiotics prescribed if she has pneumonia.  Symptoms consistent with viral illness.  She appears to have broken the fever and should be on the mend soon.  Encouraged increasing rest and fluids.  I sent prednisone  albuterol  to pharmacy for asthma exacerbation and Promethazine  DM for cough.  Thoroughly reviewed return and ER precautions.  Chest x-ray over read negative.  No change to treatment plan.  Result communicated to patient through MyChart.   Final  Clinical Impressions(s) / UC Diagnoses   Final diagnoses:  Acute cough  Acute bronchitis, unspecified organism  Fever, unspecified  Asthma with acute exacerbation, unspecified asthma severity, unspecified whether persistent     Discharge Instructions      - You are negative for COVID and flu. - Results of x-ray are pending.  If x-ray shows pneumonia you will be contacted and antibiotics will be sent. -If you do not not hear from us , presentation more consistent with viral illness and it is good sign that you have broken the fever. - I sent cough medicine and also an inhaler and prednisone  for asthma flareup. - Increase rest and fluids.  If fever returns or you begin feeling worse please consider reevaluation. - Cough related to bronchitis can last for couple weeks.     ED Prescriptions     Medication Sig Dispense Auth. Provider   predniSONE  (DELTASONE ) 20 MG tablet Take 2 tablets (40 mg total) by mouth daily for 5 days. 10 tablet Floydene Hy, PA-C   promethazine -dextromethorphan (PROMETHAZINE -DM) 6.25-15 MG/5ML syrup Take 5 mLs by mouth 4 (four) times daily as needed. 118 mL Nancy Axon B, PA-C   albuterol  (VENTOLIN  HFA) 108 (90 Base) MCG/ACT inhaler Inhale 1-2 puffs into the lungs every 6 (six) hours as needed for wheezing or shortness of breath. 1 each Shaunna [place]      PDMP not reviewed this encounter.   Floydene Hy, PA-C 11/19/23 1214

## 2023-11-21 DIAGNOSIS — N889 Noninflammatory disorder of cervix uteri, unspecified: Secondary | ICD-10-CM | POA: Diagnosis not present

## 2023-11-21 DIAGNOSIS — N888 Other specified noninflammatory disorders of cervix uteri: Secondary | ICD-10-CM | POA: Diagnosis not present

## 2023-11-21 DIAGNOSIS — Z3189 Encounter for other procreative management: Secondary | ICD-10-CM | POA: Diagnosis not present

## 2023-11-25 DIAGNOSIS — H6501 Acute serous otitis media, right ear: Secondary | ICD-10-CM | POA: Diagnosis not present

## 2023-11-30 DIAGNOSIS — R9389 Abnormal findings on diagnostic imaging of other specified body structures: Secondary | ICD-10-CM | POA: Diagnosis not present

## 2023-12-02 ENCOUNTER — Ambulatory Visit
Admission: EM | Admit: 2023-12-02 | Discharge: 2023-12-02 | Disposition: A | Discharge status: Home / Self Care | Attending: Emergency Medicine | Admitting: Emergency Medicine

## 2023-12-02 ENCOUNTER — Encounter: Payer: Self-pay | Admitting: Emergency Medicine

## 2023-12-02 DIAGNOSIS — Z3202 Encounter for pregnancy test, result negative: Secondary | ICD-10-CM | POA: Diagnosis not present

## 2023-12-02 DIAGNOSIS — B3731 Acute candidiasis of vulva and vagina: Secondary | ICD-10-CM | POA: Insufficient documentation

## 2023-12-02 LAB — URINALYSIS, W/ REFLEX TO CULTURE (INFECTION SUSPECTED)
Bilirubin Urine: NEGATIVE
Glucose, UA: NEGATIVE mg/dL
Ketones, ur: NEGATIVE mg/dL
Leukocytes,Ua: NEGATIVE
Nitrite: NEGATIVE
Protein, ur: NEGATIVE mg/dL
Specific Gravity, Urine: 1.02 (ref 1.005–1.030)
pH: 7.5 (ref 5.0–8.0)

## 2023-12-02 LAB — PREGNANCY, URINE: Preg Test, Ur: NEGATIVE

## 2023-12-02 MED ORDER — FLUCONAZOLE 150 MG PO TABS: 150.0000 mg | ORAL_TABLET | ORAL | 0 refills | Status: AC

## 2023-12-02 NOTE — ED Provider Notes (Signed)
 [facility]    CSN: [id number] Arrival date & time: 12/02/23  1421      History   Chief Complaint Chief Complaint  Patient presents with   Possible Pregnancy    HPI Jennifer Lamb is a 23 y.o. female.   HPI  23 year old female with past medical history significant for obesity, asthma, anxiety presents with request for a pregnancy test after taking a home pregnancy test this morning and having a faint positive.  She also reports that for the last week she has had urinary urgency and frequency but no pain with urination.  Approximately 3 days ago she developed some nausea and also the smell of food started to become averse to her.  She has been experiencing some vaginal spotting and saw her OB/GYN 2 days ago and was informed that it could be implantation bleeding.  The patient has been actively trying to conceive.  A pregnancy test was not collected at her OB appointment.  Past Medical History:  Diagnosis Date   Anxiety    Asthma    History of blood transfusion 10/2018   after Cesarean section 2020   Obese    Placenta previa antepartum in second trimester 07/30/2018   Placental abruption in third trimester 11/12/2018   Pregnancy 09/19/2018   Supervision of normal first teen pregnancy in first trimester 05/03/2018   Vaginal bleeding in pregnancy, third trimester     Patient Active Problem List   Diagnosis Date Noted   Encounter for fertility planning 10/18/2023   Ureterolithiasis 09/13/2023   Urinary tract obstruction due to kidney stone 09/12/2023   History of asthma 06/04/2023   Contact with and (suspected) exposure to covid-19 05/15/2020   Adult BMI 40.0-44.9 kg/sq m (HCC) 10/29/2019   Pregnancy of unknown anatomic location 08/30/2019    Past Surgical History:  Procedure Laterality Date   CESAREAN SECTION N/A 11/12/2018   Procedure: CESAREAN SECTION;  Surgeon: Teresa Fender, MD;  Location: [facility] ORS;  Service: Obstetrics;  Laterality: N/A;  STAT    TONSILLECTOMY     23 yrs old    OB History     Gravida  2   Para  2   Term  1   Preterm  1   AB      Living  2      SAB      IAB      Ectopic      Multiple  0   Live Births  2            Home Medications    Prior to Admission medications   Medication Sig Start Date End Date Taking? Authorizing Provider  fluconazole (DIFLUCAN) 150 MG tablet Take 1 tablet (150 mg total) by mouth every 3 (three) days for 3 doses. 12/02/23 12/09/23 Yes Kent Pear, NP  albuterol  (VENTOLIN  HFA) 108 (90 Base) MCG/ACT inhaler Inhale 2 puffs into the lungs every 4 (four) hours as needed for wheezing or shortness of breath. 06/04/23   Defelice, Eveleen Hinds, NP    Family History Family History  Problem Relation Age of Onset   Diabetes Maternal Grandmother    Healthy Mother    Healthy Father    Breast cancer Neg Hx     Social History Social History   Tobacco Use   Smoking status: Never   Smokeless tobacco: Never  Vaping Use   Vaping status: Former  Substance Use Topics   Alcohol use: Never   Drug use: Never  Allergies   Patient has no known allergies.   Review of Systems Review of Systems  Gastrointestinal:  Positive for nausea.  Genitourinary:  Positive for frequency, urgency and vaginal bleeding. Negative for dysuria and hematuria.     Physical Exam Triage Vital Signs ED Triage Vitals  Encounter Vitals Group     BP      Systolic BP Percentile      Diastolic BP Percentile      Pulse      Resp      Temp      Temp src      SpO2      Weight      Height      Head Circumference      Peak Flow      Pain Score      Pain Loc      Pain Education      Exclude from Growth Chart    No data found.  Updated Vital Signs BP 120/88 (BP Location: Right Arm)   Pulse (!) 109   Temp 98.6 F (37 C) (Oral)   Resp 14   Ht 5\' 2"  (1.575 m)   Wt 271 lb 6.2 oz (123.1 kg)   LMP 11/16/2023 (Approximate)   SpO2 98%   BMI 49.64 kg/m   Visual Acuity Right Eye  Distance:   Left Eye Distance:   Bilateral Distance:    Right Eye Near:   Left Eye Near:    Bilateral Near:     Physical Exam Vitals and nursing note reviewed.  Constitutional:      Appearance: Normal appearance. She is not ill-appearing.  HENT:     Head: Normocephalic and atraumatic.  Cardiovascular:     Rate and Rhythm: Normal rate and regular rhythm.     Pulses: Normal pulses.     Heart sounds: Normal heart sounds. No murmur heard.    No friction rub. No gallop.  Pulmonary:     Effort: Pulmonary effort is normal.     Breath sounds: Normal breath sounds. No wheezing, rhonchi or rales.  Abdominal:     Tenderness: There is no right CVA tenderness or left CVA tenderness.  Skin:    General: Skin is warm and dry.     Capillary Refill: Capillary refill takes less than 2 seconds.     Findings: No rash.  Neurological:     General: No focal deficit present.     Mental Status: She is alert and oriented to person, place, and time.      UC Treatments / Results  Labs (all labs ordered are listed, but only abnormal results are displayed) Labs Reviewed  URINALYSIS, W/ REFLEX TO CULTURE (INFECTION SUSPECTED) - Abnormal; Notable for the following components:      Result Value   APPearance CLOUDY (*)    Hgb urine dipstick SMALL (*)    Bacteria, UA FEW (*)    All other components within normal limits  PREGNANCY, URINE    EKG   Radiology No results found.  Procedures Procedures (including critical care time)  Medications Ordered in UC Medications - No data to display  Initial Impression / Assessment and Plan / UC Course  I have reviewed the triage vital signs and the nursing notes.  Pertinent labs & imaging results that were available during my care of the patient were reviewed by me and considered in my medical decision making (see chart for details).   Patient is a pleasant, nontoxic-appearing 23 year old female  presenting for evaluation of UTI symptoms as well as  with request for a urine pregnancy test given that she took a home test this morning that had a faintly positive line.  Her last menstrual period was on 11/12/2023.  She is actively trying to conceive and she has been experiencing some vaginal spotting.  She was seen by her OB 2 days ago and was informed that the spotting could be implantation bleeding.  3 days ago she developed some nausea and also the smell of food started to become up first to her.  She reports that she has had similar symptoms in the past when she has been pregnant as she has 2 living children.  I will order a urinalysis to evaluate for the presence of UTI as well as a urine pregnancy test.  Urine pregnancy test is negative.  Urinalysis shows cloudy appearance with small hemoglobin.  Negative for leukocyte esterase, nitrates, protein, or ketones.  Reflex microscopy shows 6-10 RBCs with few bacteria, budding yeast, and amorphous crystals.  I will discharge patient home with diagnosis of vaginal yeast infection and started on Diflucan 150 mg tablets.  1 tablet now and repeat dosing every 3 days for total of 3 doses.   Final Clinical Impressions(s) / UC Diagnoses   Final diagnoses:  Negative pregnancy test  Vaginal yeast infection     Discharge Instructions      Your urine pregnancy test was negative and your urinalysis did not show any evidence of urinary tract infection but it did show yeast on your microscopic evaluation.  I suspect that you have a vaginal yeast infection which is causing your symptoms.  Take the Diflucan 150 mg tablets for treatment of your vaginal yeast infection.  Take 1 tablet now and repeat dosing every 3 days for total of 3 doses.     ED Prescriptions     Medication Sig Dispense Auth. Provider   fluconazole (DIFLUCAN) 150 MG tablet Take 1 tablet (150 mg total) by mouth every 3 (three) days for 3 doses. 3 tablet Kent Pear, NP      PDMP not reviewed this encounter.   Kent Pear,  NP 12/02/23 [phone]

## 2023-12-02 NOTE — Discharge Instructions (Addendum)
 Your urine pregnancy test was negative and your urinalysis did not show any evidence of urinary tract infection but it did show yeast on your microscopic evaluation.  I suspect that you have a vaginal yeast infection which is causing your symptoms.  Take the Diflucan 150 mg tablets for treatment of your vaginal yeast infection.  Take 1 tablet now and repeat dosing every 3 days for total of 3 doses.

## 2023-12-02 NOTE — ED Triage Notes (Signed)
 Patient had a faint pregnancy test this morning.  Patient reports nausea, urinary frequency that started a week ago. Patient LMP 11/14/2023

## 2023-12-19 DIAGNOSIS — Z6841 Body Mass Index (BMI) 40.0 and over, adult: Secondary | ICD-10-CM | POA: Diagnosis not present

## 2023-12-19 DIAGNOSIS — Z1331 Encounter for screening for depression: Secondary | ICD-10-CM | POA: Diagnosis not present

## 2023-12-19 DIAGNOSIS — N926 Irregular menstruation, unspecified: Secondary | ICD-10-CM | POA: Diagnosis not present

## 2024-01-21 DIAGNOSIS — Z6841 Body Mass Index (BMI) 40.0 and over, adult: Secondary | ICD-10-CM | POA: Diagnosis not present

## 2024-01-21 DIAGNOSIS — E66813 Obesity, class 3: Secondary | ICD-10-CM | POA: Diagnosis not present

## 2024-02-07 ENCOUNTER — Telehealth: Payer: Self-pay

## 2024-02-07 DIAGNOSIS — Z8709 Personal history of other diseases of the respiratory system: Secondary | ICD-10-CM

## 2024-02-07 DIAGNOSIS — Z6841 Body Mass Index (BMI) 40.0 and over, adult: Secondary | ICD-10-CM

## 2024-02-07 NOTE — Progress Notes (Signed)
 Complex Care Management Note Care Guide Note  02/07/2024 Name: Jennifer Lamb MRN: [id number] DOB: 2001/01/28   Complex Care Management Outreach Attempts: An unsuccessful telephone outreach was attempted today to offer the patient information about available complex care management services.  Follow Up Plan:  No further outreach attempts will be made at this time. We have been unable to contact the patient to offer or enroll patient in complex care management services.   Encounter Outcome:  No Answer  Jeoffrey Buffalo , RMA     [facility]  [facility], [facility] Guide  Direct Dial: [phone]  Website: [email or web address]

## 2024-02-21 DIAGNOSIS — E669 Obesity, unspecified: Secondary | ICD-10-CM | POA: Diagnosis not present

## 2024-02-21 DIAGNOSIS — Z6841 Body Mass Index (BMI) 40.0 and over, adult: Secondary | ICD-10-CM | POA: Diagnosis not present

## 2024-03-17 ENCOUNTER — Telehealth: Payer: Self-pay

## 2024-03-17 DIAGNOSIS — N201 Calculus of ureter: Secondary | ICD-10-CM

## 2024-03-17 NOTE — Progress Notes (Signed)
 Complex Care Management Note Care Guide Note  03/17/2024 Name: Jennifer Lamb MRN: [id number] DOB: 2001/02/11   Complex Care Management Outreach Attempts: An unsuccessful telephone outreach was attempted today to offer the patient information about available complex care management services.  Follow Up Plan:  Additional outreach attempts will be made to offer the patient complex care management information and services.   Encounter Outcome:  No Answer  Dreama [facility]  [facility], [facility] VBCI Assistant Direct Dial: [phone]  Fax: [phone]

## 2024-03-21 NOTE — Progress Notes (Signed)
 Complex Care Management Note Care Guide Note  03/21/2024 Name: Jennifer Lamb MRN: [id number] DOB: 08-31-2000   Complex Care Management Outreach Attempts: A second unsuccessful outreach was attempted today to offer the patient with information about available complex care management services.  Follow Up Plan:  Additional outreach attempts will be made to offer the patient complex care management information and services.   Encounter Outcome:  No Answer  Dreama [facility]  [facility], [facility] VBCI Assistant Direct Dial: [phone]  Fax: [phone]

## 2024-03-25 DIAGNOSIS — Z6841 Body Mass Index (BMI) 40.0 and over, adult: Secondary | ICD-10-CM | POA: Diagnosis not present

## 2024-03-25 DIAGNOSIS — E669 Obesity, unspecified: Secondary | ICD-10-CM | POA: Diagnosis not present

## 2024-03-26 NOTE — Progress Notes (Signed)
 Complex Care Management Note Care Guide Note  03/26/2024 Name: Jennifer Lamb MRN: [id number] DOB: January 24, 2001   Complex Care Management Outreach Attempts: A third unsuccessful outreach was attempted today to offer the patient with information about available complex care management services.  Follow Up Plan:  No further outreach attempts will be made at this time. We have been unable to contact the patient to offer or enroll patient in complex care management services.  Encounter Outcome:  No Answer  Dreama [facility]  [facility], [facility] VBCI Assistant Direct Dial: [phone]  Fax: [phone]

## 2024-04-02 NOTE — Progress Notes (Deleted)
 GYN ENCOUNTER NOTE  Subjective:       Jennifer Lamb is a 23 y.o. G52P1102 female is here for gynecologic evaluation of the following issues:  1. Irregular menstrual cycles .     Gynecologic History No LMP recorded. Contraception: {method:5051} Last Pap: 11/30/2023. Results were: normal   Obstetric History OB History  Gravida Para Term Preterm AB Living  2 2 1 1  2   SAB IAB Ectopic Multiple Live Births     0 2    # Outcome Date GA Lbr Len/2nd Weight Sex Type Anes PTL Lv  2 Term 05/02/20 103w0d 06:00 / 00:36 6 lb 4.2 oz (2.84 kg) M Vag-Spont EPI  LIV  1 Preterm 11/12/18 [redacted]w[redacted]d  4 lb 9.4 oz (2.08 kg) M  Gen  LIV    Past Medical History:  Diagnosis Date   Anxiety    Asthma    History of blood transfusion 10/2018   after Cesarean section 2020   Obese    Placenta previa antepartum in second trimester 07/30/2018   Placental abruption in third trimester 11/12/2018   Pregnancy 09/19/2018   Supervision of normal first teen pregnancy in first trimester 05/03/2018   Vaginal bleeding in pregnancy, third trimester     Past Surgical History:  Procedure Laterality Date   CESAREAN SECTION N/A 11/12/2018   Procedure: CESAREAN SECTION;  Surgeon: Connell Davies, MD;  Location: [facility] ORS;  Service: Obstetrics;  Laterality: N/A;  STAT   TONSILLECTOMY     23 yrs old    Current Outpatient Medications on File Prior to Visit  Medication Sig Dispense Refill   albuterol  (VENTOLIN  HFA) 108 (90 Base) MCG/ACT inhaler Inhale 2 puffs into the lungs every 4 (four) hours as needed for wheezing or shortness of breath. 1 each 0   No current facility-administered medications on file prior to visit.    No Known Allergies  Social History   Socioeconomic History   Marital status: Significant Other    Spouse name: Elsie   Number of children: 1   Years of education: 12   Highest education level: Not on file  Occupational History   Occupation: UNEMPLOYEED  Tobacco Use   Smoking status: Never    Smokeless tobacco: Never  Vaping Use   Vaping status: Former  Substance and Sexual Activity   Alcohol use: Never   Drug use: Never   Sexual activity: Yes    Partners: Male    Birth control/protection: None  Other Topics Concern   Not on file  Social History Narrative   Not on file   Social Drivers of Health   Financial Resource Strain: Not on file  Food Insecurity: No Food Insecurity (01/21/2024)   Received from [facility]   Hunger Vital Sign    Within the past 12 months, you worried that your food would run out before you got the money to buy more.: Never true    Within the past 12 months, the food you bought just didn't last and you didn't have money to get more.: Never true  Transportation Needs: No Transportation Needs (01/21/2024)   Received from [facility]   PRAPARE - Transportation    Lack of Transportation (Medical): No    Lack of Transportation (Non-Medical): No  Physical Activity: Sufficiently Active (04/18/2018)   Exercise Vital Sign    Days of Exercise per Week: 7 days    Minutes of Exercise per Session: 30 min  Stress: Not on file  Social Connections: Not  on file  Intimate Partner Violence: Not on file    Family History  Problem Relation Age of Onset   Diabetes Maternal Grandmother    Healthy Mother    Healthy Father    Breast cancer Neg Hx     The following portions of the patient's history were reviewed and updated as appropriate: allergies, current medications, past family history, past medical history, past social history, past surgical history and problem list.  Review of Systems Review of Systems - {ros master:310782} Review of Systems - General ROS: negative for - chills, fatigue, fever, hot flashes, malaise or night sweats Hematological and Lymphatic ROS: negative for - bleeding problems or swollen lymph nodes Gastrointestinal ROS: negative for - abdominal pain, blood in stools, change in bowel habits and nausea/vomiting Musculoskeletal  ROS: negative for - joint pain, muscle pain or muscular weakness Genito-Urinary ROS: negative for - change in menstrual cycle, dysmenorrhea, dyspareunia, dysuria, genital discharge, genital ulcers, hematuria, incontinence, irregular/heavy menses, nocturia or pelvic painjj  Objective:   There were no vitals taken for this visit. CONSTITUTIONAL: Well-developed, well-nourished female in no acute distress.  HENT:  Normocephalic, atraumatic.  NECK: Normal range of motion, supple, no masses.  Normal thyroid .  SKIN: Skin is warm and dry. No rash noted. Not diaphoretic. No erythema. No pallor. NEUROLGIC: Alert and oriented to person, place, and time. PSYCHIATRIC: Normal mood and affect. Normal behavior. Normal judgment and thought content. CARDIOVASCULAR:Not Examined RESPIRATORY: Not Examined BREASTS: Not Examined ABDOMEN: Soft, non distended; Non tender.  No Organomegaly. PELVIC:  External Genitalia: Normal  BUS: Normal  Vagina: Normal  Cervix: Normal  Uterus: Normal size, shape,consistency, mobile  Adnexa: Normal  RV: Normal   Bladder: Nontender MUSCULOSKELETAL: Normal range of motion. No tenderness.  No cyanosis, clubbing, or edema.     Assessment:   There are no diagnoses linked to this encounter.    Plan:   ***

## 2024-04-03 ENCOUNTER — Ambulatory Visit: Admitting: Certified Nurse Midwife

## 2024-04-03 DIAGNOSIS — N926 Irregular menstruation, unspecified: Secondary | ICD-10-CM

## 2024-05-20 ENCOUNTER — Other Ambulatory Visit: Payer: Self-pay

## 2024-05-20 ENCOUNTER — Emergency Department

## 2024-05-20 DIAGNOSIS — R10A2 Flank pain, left side: Secondary | ICD-10-CM | POA: Diagnosis not present

## 2024-05-20 DIAGNOSIS — R197 Diarrhea, unspecified: Secondary | ICD-10-CM | POA: Diagnosis not present

## 2024-05-20 DIAGNOSIS — R1030 Lower abdominal pain, unspecified: Secondary | ICD-10-CM | POA: Diagnosis present

## 2024-05-20 LAB — CBC WITH DIFFERENTIAL/PLATELET
Abs Immature Granulocytes: 0.03 K/uL (ref 0.00–0.07)
Basophils Absolute: 0.1 K/uL (ref 0.0–0.1)
Basophils Relative: 1 %
Eosinophils Absolute: 0.1 K/uL (ref 0.0–0.5)
Eosinophils Relative: 1 %
HCT: 40 % (ref 36.0–46.0)
Hemoglobin: 13.2 g/dL (ref 12.0–15.0)
Immature Granulocytes: 0 %
Lymphocytes Relative: 37 %
Lymphs Abs: 3.3 K/uL (ref 0.7–4.0)
MCH: 28.6 pg (ref 26.0–34.0)
MCHC: 33 g/dL (ref 30.0–36.0)
MCV: 86.6 fL (ref 80.0–100.0)
Monocytes Absolute: 0.5 K/uL (ref 0.1–1.0)
Monocytes Relative: 6 %
Neutro Abs: 5 K/uL (ref 1.7–7.7)
Neutrophils Relative %: 55 %
Platelets: 279 K/uL (ref 150–400)
RBC: 4.62 MIL/uL (ref 3.87–5.11)
RDW: 13.2 % (ref 11.5–15.5)
WBC: 9 K/uL (ref 4.0–10.5)
nRBC: 0 % (ref 0.0–0.2)

## 2024-05-20 LAB — BASIC METABOLIC PANEL WITH GFR
Anion gap: 12 (ref 5–15)
BUN: 16 mg/dL (ref 6–20)
CO2: 18 mmol/L — ABNORMAL LOW (ref 22–32)
Calcium: 8.9 mg/dL (ref 8.9–10.3)
Chloride: 109 mmol/L (ref 98–111)
Creatinine, Ser: 0.71 mg/dL (ref 0.44–1.00)
GFR, Estimated: >60 mL/min (ref >60)
Glucose, Bld: 98 mg/dL (ref 70–99)
Potassium: 4 mmol/L (ref 3.5–5.1)
Sodium: 139 mmol/L (ref 135–145)

## 2024-05-20 LAB — URINALYSIS, ROUTINE W REFLEX MICROSCOPIC
Bacteria, UA: NONE SEEN
Bilirubin Urine: NEGATIVE
Glucose, UA: NEGATIVE mg/dL
Hgb urine dipstick: NEGATIVE
Ketones, ur: NEGATIVE mg/dL
Nitrite: NEGATIVE
Protein, ur: NEGATIVE mg/dL
Specific Gravity, Urine: 1.028 (ref 1.005–1.030)
pH: 5 (ref 5.0–8.0)

## 2024-05-20 LAB — POC URINE PREG, ED: Preg Test, Ur: NEGATIVE

## 2024-05-20 NOTE — ED Triage Notes (Addendum)
 Pt reports n/v/d and left flank pain for the past few days, pt also reports some n/v. Pt has hx kidney stones.

## 2024-05-21 ENCOUNTER — Emergency Department

## 2024-05-21 ENCOUNTER — Other Ambulatory Visit: Payer: Self-pay

## 2024-05-21 ENCOUNTER — Emergency Department
Admission: EM | Admit: 2024-05-21 | Discharge: 2024-05-21 | Disposition: A | Discharge status: Home / Self Care | Attending: Emergency Medicine | Admitting: Emergency Medicine

## 2024-05-21 DIAGNOSIS — Z3A00 Weeks of gestation of pregnancy not specified: Secondary | ICD-10-CM | POA: Diagnosis not present

## 2024-05-21 DIAGNOSIS — O3680X0 Pregnancy with inconclusive fetal viability, not applicable or unspecified: Secondary | ICD-10-CM

## 2024-05-21 DIAGNOSIS — R109 Unspecified abdominal pain: Secondary | ICD-10-CM

## 2024-05-21 DIAGNOSIS — O26899 Other specified pregnancy related conditions, unspecified trimester: Secondary | ICD-10-CM | POA: Diagnosis not present

## 2024-05-21 DIAGNOSIS — R197 Diarrhea, unspecified: Secondary | ICD-10-CM

## 2024-05-21 LAB — URINALYSIS, W/ REFLEX TO CULTURE (INFECTION SUSPECTED)
Bilirubin Urine: NEGATIVE
Glucose, UA: NEGATIVE mg/dL
Hgb urine dipstick: NEGATIVE
Ketones, ur: 5 mg/dL — AB
Nitrite: NEGATIVE
Protein, ur: NEGATIVE mg/dL
Specific Gravity, Urine: 1.029 (ref 1.005–1.030)
pH: 5 (ref 5.0–8.0)

## 2024-05-21 LAB — HEPATIC FUNCTION PANEL
ALT: 35 U/L (ref 0–44)
AST: 21 U/L (ref 15–41)
Albumin: 4.2 g/dL (ref 3.5–5.0)
Alkaline Phosphatase: 51 U/L (ref 38–126)
Bilirubin, Direct: 0.2 mg/dL (ref 0.0–0.2)
Indirect Bilirubin: 0.3 mg/dL (ref 0.3–0.9)
Total Bilirubin: 0.5 mg/dL (ref 0.0–1.2)
Total Protein: 7.9 g/dL (ref 6.5–8.1)

## 2024-05-21 LAB — LIPASE, BLOOD: Lipase: 32 U/L (ref 11–51)

## 2024-05-21 LAB — HCG, QUANTITATIVE, PREGNANCY: hCG, Beta Chain, Quant, S: 15 m[IU]/mL — ABNORMAL HIGH (ref <5)

## 2024-05-21 NOTE — Discharge Instructions (Addendum)
 Fortunately your evaluation in the emergency department did not show any emergency conditions that would require hospitalization or surgery at this time.  Your hCG pregnancy level was 15 and your ultrasound did not show any pregnancy nor any abnormalities.  We talked about the possibilities that this may represent either a recent pregnancy that ended with a miscarriage or very early pregnancy currently -- however for further clarity you should call your gynecologist to have this level rechecked in the next several days or week and have another reevaluation.  Please speak with your gynecologist and primary doctor for follow-up appointment.  Thank you for choosing us  for your health care today!  Please see your primary doctor this week for a follow up appointment.   If you have any new, worsening, or unexpected symptoms call your doctor right away or come back to the emergency department for reevaluation.  It was my pleasure to care for you today.   Ginnie EDISON Cyrena, MD

## 2024-05-21 NOTE — ED Provider Notes (Addendum)
 [facility] Provider Note    Event Date/Time   First MD Initiated Contact with Patient 05/21/24 [phone]     (approximate)   History   Flank Pain   HPI  Jennifer Lamb is a 23 y.o. female   Past medical history of no significant past medical history presents to Emergency Department with lower abdominal pain.  Cramping in nature associate with diarrhea.  She has been trying to get pregnant.  She had a faint positive pregnancy test at home.  She denies dysuria frequency or flank pain.  No fevers or chills.  No vaginal discharge.  Last menstrual period 1 month ago.  Independent Historian contributed to assessment above: Husband at bedside corroborates information given above  External Medical Documents Reviewed: Prior outpatient notes      Physical Exam   Triage Vital Signs: ED Triage Vitals  Encounter Vitals Group     BP 05/20/24 2207 118/81     Girls Systolic BP Percentile --      Girls Diastolic BP Percentile --      Boys Systolic BP Percentile --      Boys Diastolic BP Percentile --      Pulse Rate 05/20/24 2207 100     Resp 05/20/24 2207 17     Temp 05/20/24 2207 98.4 F (36.9 C)     Temp Source 05/20/24 2207 Oral     SpO2 05/20/24 2207 100 %     Weight 05/20/24 2206 260 lb (117.9 kg)     Height 05/20/24 2206 5' 2 (1.575 m)     Head Circumference --      Peak Flow --      Pain Score 05/20/24 2206 6     Pain Loc --      Pain Education --      Exclude from Growth Chart --     Most recent vital signs: Vitals:   05/20/24 2207 05/21/24 0256  BP: 118/81 109/65  Pulse: 100 74  Resp: 17 20  Temp: 98.4 F (36.9 C)   SpO2: 100% 100%    General: Awake, no distress.  CV:  Good peripheral perfusion.  Resp:  Normal effort.  Abd:  No distention.  Other:  Awake alert comfortable appearing normal vital signs and afebrile.  Nonfocal nonperitoneal abdominal exam.   ED Results / Procedures / Treatments   Labs (all labs ordered are  listed, but only abnormal results are displayed) Labs Reviewed  BASIC METABOLIC PANEL WITH GFR - Abnormal; Notable for the following components:      Result Value   CO2 18 (*)    All other components within normal limits  URINALYSIS, ROUTINE W REFLEX MICROSCOPIC - Abnormal; Notable for the following components:   Color, Urine YELLOW (*)    APPearance CLOUDY (*)    Leukocytes,Ua LARGE (*)    All other components within normal limits  URINALYSIS, W/ REFLEX TO CULTURE (INFECTION SUSPECTED) - Abnormal; Notable for the following components:   Color, Urine YELLOW (*)    APPearance HAZY (*)    Ketones, ur 5 (*)    Leukocytes,Ua TRACE (*)    Bacteria, UA RARE (*)    All other components within normal limits  HCG, QUANTITATIVE, PREGNANCY - Abnormal; Notable for the following components:   hCG, Beta Chain, Quant, S 15 (*)    All other components within normal limits  CBC WITH DIFFERENTIAL/PLATELET  HEPATIC FUNCTION PANEL  LIPASE, BLOOD  POC URINE PREG, ED  I ordered and reviewed the above labs they are notable for cell counts electrolytes largely unremarkable.  Negative urine pregnancy test.  Patient requested quantitative hCG which is 15.  RADIOLOGY I independently reviewed and interpreted CT abdomen pelvis and see no obvious obstructive or inflammatory change I also reviewed radiologist's formal read.   PROCEDURES:  Critical Care performed: No  Procedures   MEDICATIONS ORDERED IN ED: Medications - No data to display   IMPRESSION / MDM / ASSESSMENT AND PLAN / ED COURSE  I reviewed the triage vital signs and the nursing notes.                                Patient's presentation is most consistent with acute presentation with potential threat to life or bodily function.  Differential diagnosis includes, but is not limited to, pregnancy related or ectopic pregnancy, urinary tract infection, diarrheal illness/gastroenteritis, appendicitis, TOA/STI   The patient is on  the cardiac monitor to evaluate for evidence of arrhythmia and/or significant heart rate changes.  MDM:    I think most likely her crampy lower abdominal pain associate with diarrhea as result of a diarrheal illness.  Largely benign abdominal exam unlikely to represent surgical abdominal pathologies, renal CT obtained given some element of flank pain to check for kidney stones as she has a history of this was negative.  Without any overt urinary symptoms, and overall reassuring contaminated urinalysis, doubt urinary tract infection or pyelonephritis.  Certainly not septic  She had a reportedly positive home pregnancy test and has been trying to get pregnant.  Her quantitative hCG 15.  Perhaps she was pregnant and this is a downtrending from her recent miscarriage or this is a very early pregnancy, or just an aberrant test.  She would like an ultrasound of her pelvis as she has been told that she had an abnormal growth on her cervix in the past.  I will order a transvaginal with torsion rule out to assess for any structural abnormalities of her pelvic organs as well.  Ultimately given her stability I think she can follow-up with her gynecologist regarding the results of her transvaginal ultrasound, and HCG recheck as her evaluation today has fortunately found no evidence of any acute emergent pathologies.   FINAL CLINICAL IMPRESSION(S) / ED DIAGNOSES   Final diagnoses:  Abdominal cramping  Diarrhea, unspecified type     Rx / DC Orders   ED Discharge Orders     None        Note:  This document was prepared using Dragon voice recognition software and may include unintentional dictation errors.    Cyrena Mylar, MD 05/21/24 [id number]    Cyrena Mylar, MD 05/21/24 [phone]

## 2024-05-22 ENCOUNTER — Other Ambulatory Visit: Payer: Self-pay

## 2024-05-22 ENCOUNTER — Other Ambulatory Visit

## 2024-05-22 DIAGNOSIS — O3680X0 Pregnancy with inconclusive fetal viability, not applicable or unspecified: Secondary | ICD-10-CM

## 2024-05-22 DIAGNOSIS — R110 Nausea: Secondary | ICD-10-CM

## 2024-05-22 DIAGNOSIS — Z3200 Encounter for pregnancy test, result unknown: Secondary | ICD-10-CM

## 2024-05-22 MED ORDER — PROMETHAZINE HCL 25 MG PO TABS: 25.0000 mg | ORAL_TABLET | Freq: Four times a day (QID) | ORAL | 2 refills | Status: DC | PRN

## 2024-05-23 ENCOUNTER — Telehealth: Payer: Self-pay

## 2024-05-23 DIAGNOSIS — O3680X0 Pregnancy with inconclusive fetal viability, not applicable or unspecified: Secondary | ICD-10-CM

## 2024-05-23 LAB — BETA HCG QUANT (REF LAB): hCG Quant: 23 m[IU]/mL

## 2024-05-23 NOTE — Telephone Encounter (Signed)
 Patient presented to ED with low abdominal cramping 10/21.  LMP estimates her around [redacted] weeks pregnant and she had a positive test at home.  Ultrasound in ED showed no visible IUP and beta hcg was 15.  She was told to f/u with her gyn.  Jennifer Lamb put in an order for a repeat beta under Jennifer Lamb CNM's  name since she saw her last.  On 10/23 her beta had only increased to 23.  She is still having low abdominal and back cramping, but it is bilateral and not severe.  No bleeding.  Patient scheduled for another Beta hcg draw on Monday to make sure levels are rising appropriately.  Ectopic precautions discussed.  Patient will go to the ED if she develops sharp pelvic pain, especially if it is only on one side, or any vaginal bleeding.  Advised we will know more after her draw on Monday and can go from there.

## 2024-05-24 DIAGNOSIS — Z3A00 Weeks of gestation of pregnancy not specified: Secondary | ICD-10-CM | POA: Diagnosis not present

## 2024-05-24 DIAGNOSIS — R8290 Unspecified abnormal findings in urine: Secondary | ICD-10-CM | POA: Diagnosis not present

## 2024-05-24 DIAGNOSIS — Z3A01 Less than 8 weeks gestation of pregnancy: Secondary | ICD-10-CM | POA: Diagnosis not present

## 2024-05-24 DIAGNOSIS — O26891 Other specified pregnancy related conditions, first trimester: Secondary | ICD-10-CM | POA: Diagnosis not present

## 2024-05-24 DIAGNOSIS — O0091 Unspecified ectopic pregnancy with intrauterine pregnancy: Secondary | ICD-10-CM | POA: Diagnosis not present

## 2024-05-24 DIAGNOSIS — R102 Pelvic and perineal pain unspecified side: Secondary | ICD-10-CM | POA: Diagnosis not present

## 2024-05-24 NOTE — ED Provider Notes (Signed)
 [facility] Emergency Department Provider Note    ED Clinical Impression    Final diagnoses:  Pregnancy of unknown anatomic location (HHS-HCC) (Primary)  Abnormal finding on urinalysis        Impression, Medical Decision Making, Progress Notes and Critical Care    Impression, Differential Diagnosis and Plan of Care  This is a 23 year old female presenting for evaluation of lower back pelvic region pain with no pain in her vagina or associated discharge.  She was seen recently and had a CT scan that showed no concerning abnormalities with her abdomen and the next day was seen again and tested positive with a quantitative pregnancy testing with very low level of 15.  On exam patient appears otherwise well.  She has no significant abdominal pain on her exam, no CVA tenderness, she self swab for vaginitis.  Obtain CBC, BMP, UA reflex culture, vaginitis patient collected panel, and ultrasound transvaginal to evaluate for pregnancy of unknown location as noted previous provider visit.  Will administer 2000 mL of normal saline and Zofran  once IV access is obtained.  ED Course as of 05/24/24 1324  Sat May 24, 2024  1156 Patient's urine pregnancy was positive, UA with possible signs of infection however not obviously infectious and patient has no reported symptoms other than the pelvic pain.  This could represent infection and urine is sent for culture to make ultimate determination.  1231 Patient's quantitative hCG returned at 56.1.  Ultrasound showed  1321 Discussed findings with patient of her ultrasound and laboratory workup.  She did have a double rise in her quant however still low at 5.  Urine is possibly infectious and discussed this with the patient and will treat for urinary tract infection      Portions of this record have been created using Dragon dictation software. Dictation errors have been sought, but may not have been identified and corrected.  See  chart and resident provider documentation for details.  ____________________________________________      History     Reason for Visit Back Pain    HPI  Jennifer Lamb is a 23 y.o. female senting for further evaluation of pregnancy of unknown location with new onset of pelvic region pain that she feels to bilateral iliac crest regions rating down to her suprapubic region with no vaginal discharge or discomfort.  She did have a positive pregnancy test at home and recent hCG on chart review was at 15 and patient reports a second value of 25 on repeat testing.   External Records Reviewed (Inpatient/Outpatient notes, Prior labs/imaging studies, Care Everywhere, PDMP, External ED notes, etc)  Reviewed previous notes, labs, imaging from [facility] in relation to patient's visit.  Past Medical History[1]  Problem List[2]  Past Surgical History[3]  No current facility-administered medications for this encounter.  Current Outpatient Medications:  .  albuterol  HFA 90 mcg/actuation inhaler, Inhale 1-2 puffs., Disp: , Rfl:  .  amoxicillin  (AMOXIL ) 500 MG capsule, Take 1 capsule (500 mg total) by mouth two (2) times a day for 5 days., Disp: 10 capsule, Rfl: 0 .  ipratropium (ATROVENT ) 21 mcg (0.03 %) nasal spray, 1 spray into each nostril Three (3) times a day. (Patient not taking: Reported on 02/21/2024), Disp: 30 mL, Rfl: 0 .  ondansetron  (ZOFRAN -ODT) 4 MG disintegrating tablet, Take 1 tablet (4 mg total) by mouth every eight (8) hours as needed for nausea for up to 4 days., Disp: 12 tablet, Rfl: 0 .  WEGOVY 1.7 MG/0.75 ML SUBCUTANEOUS  PEN INJECTOR, Inject 0.75 mL (1.7 mg total) under the skin every seven (7) days., Disp: 3 mL, Rfl: 2  Allergies Patient has no known allergies.  Family History[4]  Social History Short Social History[5]    Physical Exam   ED Triage Vitals [05/24/24 0838]  Enc Vitals Group     BP 135/83     Pulse 108     SpO2 Pulse      Resp 16     Temp 36.4 C  (97.5 F)     Temp Source Temporal     SpO2 100 %     Weight (!) 116.6 kg (257 lb)     Height      Head Circumference      Peak Flow      Pain Score      Pain Loc      Pain Education      Exclude from Growth Chart     Constitutional: Alert and oriented. Well appearing and in no distress. Eyes: Conjunctivae are normal. Skin: Skin is warm, dry and intact. No rash noted. Psychiatric: Mood and affect are normal. Speech and behavior are normal.    Radiology   US  Pelvic (Transvaginal) - PREGNANCY RELATED  Final Result    No definite intrauterine pregnancy identified. Given positive pregnancy test, this represents a pregnancy of unknown location. Differential diagnosis includes early intrauterine pregnancy, pregnancy loss and ectopic pregnancy. Recommend clinical correlation, serial quantitative beta HCGs, ectopic precautions, and follow-up ultrasound as clinically appropriate.    There is thickening of the endometrium with few nonspecific tiny cystic foci within the anterior endometrium, an indeterminate finding given the lack of a quantitative beta-hCG value at time of dictation. Recommend obtaining quantitative beta hCG.        Clinical management and follow up per obstetrical team, which can include ectopic precautions, trending of hCG, and follow up ultrasound as indicated.    ====================  MODIFIED REPORT:  (05/24/2024 11:26 AM)  This report has been modified from its preliminary version; you may check the prior versions of radiology report, results history link for prior report versions (if they were previously visible in Epic).    -----------------------------------------------                           [1] Past Medical History: Diagnosis Date  . Asthma (HHS-HCC)   . History of placenta previa   . History of preterm delivery, currently pregnant (HHS-HCC)   . Obesity   . PCOS (polycystic ovarian syndrome)   . Prior pregnancy with placenta  abruption, antepartum (HHS-HCC)   [2] Patient Active Problem List Diagnosis  . Pregnancy of unknown anatomic location (HHS-HCC)  . Urinary tract obstruction due to kidney stone  . Ureterolithiasis  . Encounter for fertility planning  . Adult BMI 40.0-44.9 kg/sq m (CMS-HCC)  . Contact with and (suspected) exposure to covid-19  . History of asthma  [3] Past Surgical History: Procedure Laterality Date  . CESAREAN SECTION    . CHG X-RAY RETROGRADE PYELOGRAM Right 09/13/2023   Procedure: CHG X-RAY RETROGRADE PYELOGRAM;  Surgeon: Osie Sergio Miyamoto, MD;  Location: [facility] PROCEDURE SUITES [facility];  Service: Urology  . CHG X-RAY RETROGRADE PYELOGRAM Right 10/25/2023   Procedure: CHG X-RAY RETROGRADE PYELOGRAM;  Surgeon: Osie Sergio Miyamoto, MD;  Location: [facility] PROCEDURE SUITES [facility];  Service: Urology  . OTHER SURGICAL HISTORY  2008 was the year i believe   Tonsil removal  . PR CYSTO/URETERO/PYELOSCOPY,  DX Right 10/25/2023   Procedure: CYSTOURETHOSCOPY, WITH URETEROSCOPY AND/OR PYELOSCOPY; DIAGNOSTIC;  Surgeon: Osie Sergio Miyamoto, MD;  Location: [facility] PROCEDURE SUITES [facility];  Service: Urology  . PR CYSTOSCOPY,INSERT URETERAL STENT Right 09/13/2023   Procedure: CYSTOURETHROSCOPY,  WITH INSERTION OF INDWELLING URETERAL STENT (EG, GIBBONS OR DOUBLE-J TYPE);  Surgeon: Osie Sergio Miyamoto, MD;  Location: [facility] PROCEDURE SUITES [facility];  Service: Urology  . PR CYSTOSCOPY,REMV CALCULUS,SIMPLE Right 10/25/2023   Procedure: CYSTOURETHROSCOPY, WITH REMOVAL OF FOREIGN BODY, CALCULUS OR URETERAL STENT FROM URETHRA OR BLADDER; SIMPLE;  Surgeon: Osie Sergio Miyamoto, MD;  Location: [facility] PROCEDURE SUITES [facility];  Service: Urology  . PR CYSTOURETHROSCOPY,URETER CATHETER Right 09/13/2023   Procedure: CYSTOURETHROSCOPY, W/URETERAL CATHETERIZATION, W/WO IRRIG, INSTILL, OR URETEROPYELOG, EXCLUS OF RADIOLG SVC;  Surgeon: Osie Sergio Miyamoto, MD;  Location: [facility] PROCEDURE SUITES [facility];  Service: Urology  . PR CYSTOURETHROSCOPY,URETER  CATHETER Right 10/25/2023   Procedure: CYSTOURETHROSCOPY, W/URETERAL CATHETERIZATION, W/WO IRRIG, INSTILL, OR URETEROPYELOG, EXCLUS OF RADIOLG SVC;  Surgeon: Osie Sergio Miyamoto, MD;  Location: [facility] PROCEDURE SUITES [facility];  Service: Urology  . TONSILLECTOMY    [4] No family history on file. [5] Social History Tobacco Use  . Smoking status: Former    Current packs/day: 0.00    Types: Cigarettes    Quit date: 08/2019    Years since quitting: 4.8    Passive exposure: Past  . Smokeless tobacco: Never  Vaping Use  . Vaping status: Former  Substance Use Topics  . Alcohol use: Not Currently  . Drug use: Never   Lennie Donnice [place], PA 05/24/24 1324

## 2024-05-25 ENCOUNTER — Ambulatory Visit: Payer: Self-pay | Admitting: Certified Nurse Midwife

## 2024-05-25 DIAGNOSIS — N939 Abnormal uterine and vaginal bleeding, unspecified: Secondary | ICD-10-CM

## 2024-05-26 ENCOUNTER — Other Ambulatory Visit

## 2024-05-26 DIAGNOSIS — O3680X0 Pregnancy with inconclusive fetal viability, not applicable or unspecified: Secondary | ICD-10-CM | POA: Diagnosis not present

## 2024-05-27 ENCOUNTER — Ambulatory Visit: Payer: Self-pay | Admitting: Certified Nurse Midwife

## 2024-05-27 ENCOUNTER — Other Ambulatory Visit: Payer: Self-pay | Admitting: Certified Nurse Midwife

## 2024-05-27 DIAGNOSIS — Z3200 Encounter for pregnancy test, result unknown: Secondary | ICD-10-CM

## 2024-05-27 DIAGNOSIS — O3680X0 Pregnancy with inconclusive fetal viability, not applicable or unspecified: Secondary | ICD-10-CM

## 2024-05-27 LAB — BETA HCG QUANT (REF LAB): hCG Quant: 121 m[IU]/mL

## 2024-05-28 ENCOUNTER — Other Ambulatory Visit

## 2024-05-29 DIAGNOSIS — Z3490 Encounter for supervision of normal pregnancy, unspecified, unspecified trimester: Secondary | ICD-10-CM | POA: Diagnosis not present

## 2024-06-05 ENCOUNTER — Encounter: Payer: Self-pay | Admitting: Advanced Practice Midwife

## 2024-06-05 ENCOUNTER — Other Ambulatory Visit

## 2024-06-05 ENCOUNTER — Ambulatory Visit (INDEPENDENT_AMBULATORY_CARE_PROVIDER_SITE_OTHER): Admitting: Advanced Practice Midwife

## 2024-06-05 VITALS — BP 96/72 | HR 92 | Resp 16 | Ht 62.0 in | Wt 268.5 lb

## 2024-06-05 DIAGNOSIS — N939 Abnormal uterine and vaginal bleeding, unspecified: Secondary | ICD-10-CM

## 2024-06-05 DIAGNOSIS — Z3490 Encounter for supervision of normal pregnancy, unspecified, unspecified trimester: Secondary | ICD-10-CM | POA: Diagnosis not present

## 2024-06-05 NOTE — Progress Notes (Signed)
 Patient ID: Jennifer Lamb, female   DOB: Sep 18, 2000, 23 y.o.   MRN: [id number]  Reason for Consult: Ovarian Cyst   Referred by Towana Small, FNP  Subjective:  HPI:  Jennifer Lamb is a 23 y.o. female here for evaluation of ovarian cyst per ultrasonographer. She had a dating ultrasound done at an outside facility yesterday (access to this report is not available) and they noted that she had a cyst and should have it checked. Per the patient, gestational sac was seen yesterday. She is currently [redacted] weeks pregnant according to her LMP.   Per ultrasound done at [facility] on 05/21/24, there is no mention of cysts. Beta Hcg levels have been increasing appropriately. Beta level drawn today. Will repeat level on Monday and have viability ultrasound in 2 weeks. Reassurance given today regarding possible cyst.   Past Medical History:  Diagnosis Date   Anxiety    Asthma    History of blood transfusion 10/2018   after Cesarean section 2020   Obese    Placenta previa antepartum in second trimester 07/30/2018   Placental abruption in third trimester 11/12/2018   Pregnancy 09/19/2018   Supervision of normal first teen pregnancy in first trimester 05/03/2018   Vaginal bleeding in pregnancy, third trimester    Family History  Problem Relation Age of Onset   Diabetes Maternal Grandmother    Healthy Mother    Healthy Father    Breast cancer Neg Hx    Past Surgical History:  Procedure Laterality Date   CESAREAN SECTION N/A 11/12/2018   Procedure: CESAREAN SECTION;  Surgeon: Connell Davies, MD;  Location: [facility] ORS;  Service: Obstetrics;  Laterality: N/A;  STAT   TONSILLECTOMY     23 yrs old    Short Social History:  Social History   Tobacco Use   Smoking status: Never   Smokeless tobacco: Never  Substance Use Topics   Alcohol use: Never    No Known Allergies  Current Outpatient Medications  Medication Sig Dispense Refill   albuterol  (VENTOLIN  HFA) 108 (90 Base) MCG/ACT inhaler  Inhale 2 puffs into the lungs every 4 (four) hours as needed for wheezing or shortness of breath. 1 each 0   No current facility-administered medications for this visit.    Review of Systems  Constitutional:  Negative for chills and fever.  HENT:  Negative for congestion, ear discharge, ear pain, hearing loss, sinus pain and sore throat.   Eyes:  Negative for blurred vision and double vision.  Respiratory:  Negative for cough, shortness of breath and wheezing.   Cardiovascular:  Negative for chest pain, palpitations and leg swelling.  Gastrointestinal:  Negative for abdominal pain, blood in stool, constipation, diarrhea, heartburn, melena, nausea and vomiting.  Genitourinary:  Negative for dysuria, flank pain, frequency, hematuria and urgency.  Musculoskeletal:  Negative for back pain, joint pain and myalgias.  Skin:  Negative for itching and rash.  Neurological:  Negative for dizziness, tingling, tremors, sensory change, speech change, focal weakness, seizures, loss of consciousness, weakness and headaches.  Endo/Heme/Allergies:  Negative for environmental allergies. Does not bruise/bleed easily.  Psychiatric/Behavioral:  Negative for depression, hallucinations, memory loss, substance abuse and suicidal ideas. The patient is not nervous/anxious and does not have insomnia.        Objective:  Objective   Vitals:   06/05/24 0852  BP: 96/72  Pulse: 92  Resp: 16  Weight: 268 lb 8 oz (121.8 kg)  Height: 5' 2 (1.575 m)   Body mass  index is 49.11 kg/m. Constitutional: obese female in no acute distress.  HEENT: normal Skin: Warm and dry.  Cardiovascular: Regular rate and rhythm.   Extremity: no edema  Respiratory:  Normal respiratory effort Psych: Alert and Oriented x3. No memory deficits. Normal mood and affect.     Assessment/Plan:     23 y.o G2 P79 female, gestation with unknown age/viability  Beta Hcg today Repeat Beta level Monday Viability ultrasound in 2  weeks Follow up as needed   Slater Rains, CNM [facility] 06/05/2024 9:04 AM

## 2024-06-06 ENCOUNTER — Telehealth: Payer: Self-pay

## 2024-06-06 LAB — BETA HCG QUANT (REF LAB): hCG Quant: 3749 m[IU]/mL

## 2024-06-06 NOTE — Telephone Encounter (Signed)
 Beta levels have increased. She would like to know what's her next steps. Should she do another one next week or should she schedule her NOB Intake.

## 2024-06-09 ENCOUNTER — Other Ambulatory Visit

## 2024-06-09 ENCOUNTER — Ambulatory Visit: Payer: Self-pay | Admitting: Certified Nurse Midwife

## 2024-06-09 DIAGNOSIS — Z3490 Encounter for supervision of normal pregnancy, unspecified, unspecified trimester: Secondary | ICD-10-CM | POA: Diagnosis not present

## 2024-06-10 ENCOUNTER — Telehealth: Payer: Self-pay

## 2024-06-10 LAB — BETA HCG QUANT (REF LAB): hCG Quant: 11382 m[IU]/mL

## 2024-06-11 ENCOUNTER — Other Ambulatory Visit

## 2024-06-14 ENCOUNTER — Ambulatory Visit: Payer: Self-pay | Admitting: Advanced Practice Midwife

## 2024-06-16 ENCOUNTER — Ambulatory Visit

## 2024-06-17 NOTE — Telephone Encounter (Signed)
 Message closing

## 2024-06-20 ENCOUNTER — Ambulatory Visit (INDEPENDENT_AMBULATORY_CARE_PROVIDER_SITE_OTHER)

## 2024-06-20 ENCOUNTER — Telehealth (INDEPENDENT_AMBULATORY_CARE_PROVIDER_SITE_OTHER)

## 2024-06-20 DIAGNOSIS — Z3490 Encounter for supervision of normal pregnancy, unspecified, unspecified trimester: Secondary | ICD-10-CM

## 2024-06-20 DIAGNOSIS — O3680X0 Pregnancy with inconclusive fetal viability, not applicable or unspecified: Secondary | ICD-10-CM | POA: Diagnosis not present

## 2024-06-20 DIAGNOSIS — Z98891 History of uterine scar from previous surgery: Secondary | ICD-10-CM | POA: Insufficient documentation

## 2024-06-20 DIAGNOSIS — Z3A01 Less than 8 weeks gestation of pregnancy: Secondary | ICD-10-CM

## 2024-06-20 DIAGNOSIS — O0990 Supervision of high risk pregnancy, unspecified, unspecified trimester: Secondary | ICD-10-CM | POA: Insufficient documentation

## 2024-06-20 DIAGNOSIS — Z3480 Encounter for supervision of other normal pregnancy, unspecified trimester: Secondary | ICD-10-CM | POA: Insufficient documentation

## 2024-06-20 DIAGNOSIS — Z8759 Personal history of other complications of pregnancy, childbirth and the puerperium: Secondary | ICD-10-CM | POA: Insufficient documentation

## 2024-06-20 DIAGNOSIS — Z3689 Encounter for other specified antenatal screening: Secondary | ICD-10-CM

## 2024-06-20 NOTE — Progress Notes (Signed)
 New OB Intake  I connected with  Jennifer Lamb on 06/20/24 at  9:15 AM EST by MyChart Video Visit and verified that I am speaking with the correct person using two identifiers. Nurse is located at [facility] and pt is located at home.  I discussed the limitations, risks, security and privacy concerns of performing an evaluation and management service by telephone and the availability of in person appointments. I also discussed with the patient that there may be a patient responsible charge related to this service. The patient expressed understanding and agreed to proceed.  I explained I am completing New OB Intake today. We discussed her EDD of 01/20/25 that is based on LMP of 04/15/24. Pt is G3/P2. I reviewed her allergies, medications, Medical/Surgical/OB history, and appropriate screenings. There are cats in the home: yes. If yes: Indoor. Based on history, this is a/an pregnancy uncomplicated . Her obstetrical history is significant for history of c-section, placenta previa, and placental abruption with first pregnancy.  Patient Active Problem List   Diagnosis Date Noted   Supervision of other normal pregnancy, antepartum 06/20/2024   History of C-section 06/20/2024   History of placenta previa 06/20/2024   History of placental abruption 06/20/2024   Ureterolithiasis 09/13/2023   Urinary tract obstruction due to kidney stone 09/12/2023   History of asthma 06/04/2023   Adult BMI 45.0-49.9 kg/sq m (HCC) 10/29/2019    Concerns addressed today: None  Delivery Plans:  Plans to deliver at [facility].  Anatomy US  Explained first scheduled US  will be 06/20/24. Anatomy US  will be scheduled around [redacted] weeks gestational age.  Labs Discussed genetic screening with patient. Patient desires genetic testing to be drawn at new OB visit. Discussed possible labs to be drawn at new OB appointment.  COVID Vaccine Patient has not had COVID vaccine.   Social Determinants of  Health Food Insecurity: denies food insecurity Transportation: Patient denies transportation needs. Childcare: Discussed no children allowed at ultrasound appointments.   First visit review I reviewed new OB appt with pt. I explained she will have blood work and pap smear/pelvic exam if indicated. Explained pt will be seen by an [facility] provider at first visit; encounter routed to appropriate provider.   Beola Skeens, CMA 06/20/2024  9:35 AM

## 2024-06-20 NOTE — Patient Instructions (Signed)
 First Trimester of Pregnancy  The first trimester of pregnancy starts on the first day of your last monthly period until the end of week 13. This is months 1 through 3 of pregnancy. A week after a sperm fertilizes an egg, the egg will implant into the wall of the uterus and begin to develop into a baby. Body changes during your first trimester Your body goes through many changes during pregnancy. The changes usually return to normal after your baby is born. Physical changes Your breasts may grow larger and may hurt. The area around your nipples may get darker. Your periods will stop. Your hair and nails may grow faster. You may pee more often. Health changes You may tire easily. Your gums may bleed and may be sensitive when you brush and floss. You may not feel hungry. You may have heartburn. You may throw up or feel like you may throw up. You may want to eat some foods, but not others. You may have headaches. You may have trouble pooping (constipation). Other changes Your emotions may change from day to day. You may have more dreams. Follow these instructions at home: Medicines Talk to your health care provider if you're taking medicines. Ask if the medicines are safe to take during pregnancy. Your provider may change the medicines that you take. Do not take any medicines unless told to by your provider. Take a prenatal vitamin that has at least 600 micrograms (mcg) of folic acid. Do not use herbal medicines, illegal substances, or medicines that are not approved by your provider. Eating and drinking While you're pregnant your body needs extra food for your growing baby. Talk with your provider about what to eat while pregnant. Activity Most women are able to exercise during pregnancy. Exercises may need to change as your pregnancy goes on. Talk to your provider about your activities and exercise routines. Relieving pain and discomfort Wear a good, supportive bra if your breasts  hurt. Rest with your legs raised if you have leg cramps or low back pain. Safety Wear your seatbelt at all times when you're in a car. Talk to your provider if someone hits you, hurts you, or yells at you. Talk with your provider if you're feeling sad or have thoughts of hurting yourself. Lifestyle Certain things can be harmful while you're pregnant. Follow these rules: Do not use hot tubs, steam rooms, or saunas. Do not douche. Do not use tampons or scented pads. Do not drink alcohol,smoke, vape, or use products with nicotine or tobacco in them. If you need help quitting, talk with your provider. Avoid cat litter boxes and soil used by cats. These things carry germs that can cause harm to your pregnancy and your baby. General instructions Keep all follow-up visits. It helps you and your unborn baby stay as healthy as possible. Write down your questions. Take them to your visits. Your provider will: Talk with you about your overall health. Give you advice or refer you to specialists who can help with different needs, including: Prenatal education classes. Mental health and counseling. Foods and healthy eating. Ask for help if you need help with food. Call your dentist and ask to be seen. Brush your teeth with a soft toothbrush. Floss gently. Where to find more information American Pregnancy Association: americanpregnancy.org [employer]: acog.org Office on [employer] Health: [employer] Contact a health care provider if: You feel dizzy, faint, or have a fever. You vomit or have watery poop (diarrhea) for 2  days or more. You have abnormal discharge or bleeding from your vagina. You have pain when you pee or your pee smells bad. You have cramps, pain, or pressure in your belly area. Get help right away if: You have trouble breathing or chest pain. You have any kind of injury, such as from a fall or a car crash. These symptoms may be an  emergency. Get help right away. Call 911. Do not wait to see if the symptoms will go away. Do not drive yourself to the hospital. This information is not intended to replace advice given to you by your health care provider. Make sure you discuss any questions you have with your health care provider. Document Revised: 04/19/2023 Document Reviewed: 11/17/2022 Elsevier Patient Education  2024 [employer]   Common Medications Safe in Pregnancy  Acne:      Constipation:  Benzoyl Peroxide     Colace  Clindamycin      Dulcolax Suppository  Topica Erythromycin     Fibercon  Salicylic Acid      Metamucil         Miralax AVOID:        Senakot   Accutane    Cough:  Retin-A       Cough Drops  Tetracycline      Phenergan w/ Codeine if Rx  Minocycline      Robitussin (Plain & DM)  Antibiotics:     Crabs/Lice:  Ceclor       RID  Cephalosporins    AVOID:  E-Mycins      Kwell  Keflex  Macrobid/Macrodantin   Diarrhea:  Penicillin      Kao-Pectate  Zithromax      Imodium AD         PUSH FLUIDS AVOID:       Cipro     Fever:  Tetracycline      Tylenol (Regular or Extra  Minocycline       Strength)  Levaquin      Extra Strength-Do not          Exceed 8 tabs/24 hrs Caffeine:        200mg /day (equiv. To 1 cup of coffee or  approx. 3 12 oz sodas)         Gas: Cold/Hayfever:       Gas-X  Benadryl      Mylicon  Claritin       Phazyme  **Claritin-D        Chlor-Trimeton    Headaches:  Dimetapp      ASA-Free Excedrin  Drixoral-Non-Drowsy     Cold Compress  Mucinex (Guaifenasin)     Tylenol (Regular or Extra  Sudafed/Sudafed-12 Hour     Strength)  **Sudafed PE Pseudoephedrine   Tylenol Cold & Sinus     Vicks Vapor Rub  Zyrtec  **AVOID if Problems With Blood Pressure         Heartburn: Avoid lying down for at least 1 hour after meals  Aciphex      Maalox     Rash:  Milk of Magnesia     Benadryl    Mylanta       1% Hydrocortisone Cream  Pepcid  Pepcid Complete   Sleep  Aids:  Prevacid      Ambien   Prilosec       Benadryl  Rolaids       Chamomile Tea  Tums (Limit 4/day)     Unisom  Tylenol PM         Warm milk-add vanilla or  Hemorrhoids:       Sugar for taste  Anusol/Anusol H.C.  (RX: Analapram 2.5%)  Sugar Substitutes:  Hydrocortisone OTC     Ok in moderation  Preparation H      Tucks        Vaseline lotion applied to tissue with wiping    Herpes:     Throat:  Acyclovir      Oragel  Famvir  Valtrex     Vaccines:         Flu Shot Leg Cramps:       *Gardasil  Benadryl      Hepatitis A         Hepatitis B Nasal Spray:       Pneumovax  Saline Nasal Spray     Polio Booster         Tetanus Nausea:       Tuberculosis test or PPD  Vitamin B6 25 mg TID   AVOID:    Dramamine      *Gardasil  Emetrol       Live Poliovirus  Ginger Root 250 mg QID    MMR (measles, mumps &  High Complex Carbs @ Bedtime    rebella)  Sea Bands-Accupressure    Varicella (Chickenpox)  Unisom 1/2 tab TID     *No known complications           If received before Pain:         Known pregnancy;   Darvocet       Resume series after  Lortab        Delivery  Percocet    Yeast:   Tramadol      Femstat  Tylenol 3      Gyne-lotrimin  Ultram       Monistat  Vicodin           MISC:         All Sunscreens           Hair Coloring/highlights          Insect Repellant's          (Including DEET)         Mystic Tans   Commonly Asked Questions During Pregnancy   Cats: A parasite can be excreted in cat feces.  To avoid exposure you need to have another person empty the little box.  If you must empty the litter box you will need to wear gloves.  Wash your hands after handling your cat.  This parasite can also be found in raw or undercooked meat so this should also be avoided.  Colds, Sore Throats, Flu: Please check your medication sheet to see what you can take for symptoms.  If your symptoms are unrelieved by these medications please call the office.  Dental Work: Most  any dental work Agricultural consultant recommends is permitted.  X-rays should only be taken during the first trimester if absolutely necessary.  Your abdomen should be shielded with a lead apron during all x-rays.  Please notify your provider prior to receiving any x-rays.  Novocaine is fine; gas is not recommended.  If your dentist requires a note from [place] prior to dental work please call the office and we will provide one for you.  Exercise: Exercise is an important part of staying healthy during your pregnancy.  You may continue most exercises you were accustomed to prior to pregnancy.  Later in your pregnancy you will most likely notice you have difficulty with activities requiring balance like riding a bicycle.  It is important that you listen to your body and avoid activities that put you at a higher risk of falling.  Adequate rest and staying well hydrated are a must!  If you have questions about the safety of specific activities ask your provider.    Exposure to Children with illness: Try to avoid obvious exposure; report any symptoms to [place] when noted,  If you have chicken pos, red measles or mumps, you should be immune to these diseases.   Please do not take any vaccines while pregnant unless you have checked with your OB provider.  Fetal Movement: After 28 weeks we recommend you do "kick counts" twice daily.  Lie or sit down in a calm quiet environment and count your baby movements "kicks".  You should feel your baby at least 10 times per hour.  If you have not felt 10 kicks within the first hour get up, walk around and have something sweet to eat or drink then repeat for an additional hour.  If count remains less than 10 per hour notify your provider.  Fumigating: Follow your pest control agent's advice as to how long to stay out of your home.  Ventilate the area well before re-entering.  Hemorrhoids:   Most over-the-counter preparations can be used during pregnancy.  Check your medication to see what is  safe to use.  It is important to use a stool softener or fiber in your diet and to drink lots of liquids.  If hemorrhoids seem to be getting worse please call the office.   Hot Tubs:  Hot tubs Jacuzzis and saunas are not recommended while pregnant.  These increase your internal body temperature and should be avoided.  Intercourse:  Sexual intercourse is safe during pregnancy as long as you are comfortable, unless otherwise advised by your provider.  Spotting may occur after intercourse; report any bright red bleeding that is heavier than spotting.  Labor:  If you know that you are in labor, please go to the hospital.  If you are unsure, please call the office and let us help you decide what to do.  Lifting, straining, etc:  If your job requires heavy lifting or straining please check with your provider for any limitations.  Generally, you should not lift items heavier than that you can lift simply with your hands and arms (no back muscles)  Painting:  Paint fumes do not harm your pregnancy, but may make you ill and should be avoided if possible.  Latex or water based paints have less odor than oils.  Use adequate ventilation while painting.  Permanents & Hair Color:  Chemicals in hair dyes are not recommended as they cause increase hair dryness which can increase hair loss during pregnancy.  " Highlighting" and permanents are allowed.  Dye may be absorbed differently and permanents may not hold as well during pregnancy.  Sunbathing:  Use a sunscreen, as skin burns easily during pregnancy.  Drink plenty of fluids; avoid over heating.  Tanning Beds:  Because their possible side effects are still unknown, tanning beds are not recommended.  Ultrasound Scans:  Routine ultrasounds are performed at approximately 20 weeks.  You will be able to see your baby's general anatomy an if you would like to know the gender this can usually be determined as well.  If it is questionable when you conceived you may  also  receive an ultrasound early in your pregnancy for dating purposes.  Otherwise ultrasound exams are not routinely performed unless there is a medical necessity.  Although you can request a scan we ask that you pay for it when conducted because insurance does not cover " patient request" scans.  Work: If your pregnancy proceeds without complications you may work until your due date, unless your physician or employer advises otherwise.  Round Ligament Pain/Pelvic Discomfort:  Sharp, shooting pains not associated with bleeding are fairly common, usually occurring in the second trimester of pregnancy.  They tend to be worse when standing up or when you remain standing for long periods of time.  These are the result of pressure of certain pelvic ligaments called "round ligaments".  Rest, Tylenol and heat seem to be the most effective relief.  As the womb and fetus grow, they rise out of the pelvis and the discomfort improves.  Please notify the office if your pain seems different than that described.  It may represent a more serious condition.

## 2024-06-23 ENCOUNTER — Telehealth: Payer: Self-pay

## 2024-06-23 MED ORDER — DOXYLAMINE-PYRIDOXINE 10-10 MG PO TBEC: 2.0000 | DELAYED_RELEASE_TABLET | Freq: Every day | ORAL | 1 refills | Status: AC

## 2024-06-23 NOTE — Telephone Encounter (Signed)
 Patient is [redacted]w[redacted]d and called nurse triage because her nausea has been getting progressively worse.  She hasn't been able to hold any food or fluids down since yesterday.   She filled her zofran  4mg  odt pills yesterday afternoon but she says she was unable to hold them down last night.  This morning tried taking another one but vomited that as well.  She says she feels warm and congested and wonders if she may have a stomach bug.  She has no way to check her temperature at home. Counseled it is okay to take Tylenol  if she is feeling feverish to see if that helps her feel a little better.  We discussed vitamin B6 and Unisom  as an option once she is feeling better, but with her n/v this severe, she doesn't think she can hold all those pills down.  Advised she can go to the ED for fluids and IV zofran  if she is feeling badly.  Otherwise, offered to call in a different medication for her nausea to see if that helps.  She would like a different med.  Rx for Diclegis  sent to patient pharmacy.  We discussed sipping on pedialyte and trying peppermint/ginger teas or candies to soothe her stomach.  She will go to the ED if symptoms persist and she is unable to hold food/fluids down over the next 24 hours.

## 2024-06-24 NOTE — Telephone Encounter (Signed)
 Erroneous note

## 2024-07-25 ENCOUNTER — Other Ambulatory Visit (HOSPITAL_COMMUNITY)
Admission: RE | Admit: 2024-07-25 | Discharge: 2024-07-25 | Disposition: A | Discharge status: Home / Self Care | Source: Ambulatory Visit | Attending: Certified Nurse Midwife | Admitting: Certified Nurse Midwife

## 2024-07-25 ENCOUNTER — Ambulatory Visit: Admitting: Certified Nurse Midwife

## 2024-07-25 VITALS — BP 115/76 | HR 108 | Wt 265.8 lb

## 2024-07-25 DIAGNOSIS — Z3A14 14 weeks gestation of pregnancy: Secondary | ICD-10-CM | POA: Diagnosis not present

## 2024-07-25 DIAGNOSIS — O34219 Maternal care for unspecified type scar from previous cesarean delivery: Secondary | ICD-10-CM

## 2024-07-25 DIAGNOSIS — Z0283 Encounter for blood-alcohol and blood-drug test: Secondary | ICD-10-CM | POA: Diagnosis not present

## 2024-07-25 DIAGNOSIS — O09291 Supervision of pregnancy with other poor reproductive or obstetric history, first trimester: Secondary | ICD-10-CM | POA: Diagnosis not present

## 2024-07-25 DIAGNOSIS — Z113 Encounter for screening for infections with a predominantly sexual mode of transmission: Secondary | ICD-10-CM | POA: Insufficient documentation

## 2024-07-25 DIAGNOSIS — Z6841 Body Mass Index (BMI) 40.0 and over, adult: Secondary | ICD-10-CM

## 2024-07-25 DIAGNOSIS — Z3480 Encounter for supervision of other normal pregnancy, unspecified trimester: Secondary | ICD-10-CM

## 2024-07-25 DIAGNOSIS — Z3A12 12 weeks gestation of pregnancy: Secondary | ICD-10-CM | POA: Diagnosis not present

## 2024-07-25 DIAGNOSIS — Z130 Encounter for screening for diseases of the blood and blood-forming organs and certain disorders involving the immune mechanism: Secondary | ICD-10-CM | POA: Diagnosis not present

## 2024-07-25 DIAGNOSIS — Z1379 Encounter for other screening for genetic and chromosomal anomalies: Secondary | ICD-10-CM

## 2024-07-25 DIAGNOSIS — Z131 Encounter for screening for diabetes mellitus: Secondary | ICD-10-CM

## 2024-07-25 DIAGNOSIS — O0990 Supervision of high risk pregnancy, unspecified, unspecified trimester: Secondary | ICD-10-CM

## 2024-07-25 DIAGNOSIS — Z363 Encounter for antenatal screening for malformations: Secondary | ICD-10-CM

## 2024-07-25 DIAGNOSIS — Z98891 History of uterine scar from previous surgery: Secondary | ICD-10-CM

## 2024-07-25 DIAGNOSIS — Z8759 Personal history of other complications of pregnancy, childbirth and the puerperium: Secondary | ICD-10-CM

## 2024-07-25 NOTE — Progress Notes (Signed)
 NEW OB HISTORY AND PHYSICAL  SUBJECTIVE:       Jennifer Lamb is a 23 y.o. [phone] female, Patient's last menstrual period was 04/15/2024 (exact date)., Estimated Date of Delivery: 01/20/25, [redacted]w[redacted]d, presents today for establishment of Prenatal Care. She reports headache, visual changes, abdominal pain in the left lower quadrant, and severe backache. She is very concerned about backache and whether this is a sign of a complication or preterm labor due to her history of pregnancy complications-previa, abruption & Cesarean delivery. Stopped GLP1 with +UPT, desired pregnancy, had been trying to conceive for ~2y without success.  EDD updated based on first trimester ultrasound to 02/01/2025  Social history Partner/Relationship:  Engaged to [employer] situation:  living together Work:  Delivery with [employer] Exercise:  No Substance use: Denies  Indications for ASA therapy (per uptodate) One of the following: Previous pregnancy with preeclampsia, especially early onset and with an adverse outcome No Multifetal gestation No Chronic hypertension No Type 1 or 2 diabetes mellitus No Chronic kidney disease No Autoimmune disease (antiphospholipid syndrome, systemic lupus erythematosus) No  Two or more of the following: Nulliparity No Obesity (body mass index >30 kg/m2) Yes Family history of preeclampsia in mother or sister No Age >=35 years No Sociodemographic characteristics (African American race, low socioeconomic level) No Personal risk factors (eg, previous pregnancy with low birth weight or small for gestational age infant, previous adverse pregnancy outcome [eg, stillbirth], interval >10 years between pregnancies) Yes  Gynecologic History Patient's last menstrual period was 04/15/2024 (exact date). Normal Contraception: none Last Pap:     Component Value Date/Time   DIAGPAP  06/12/2023 1423    - Negative for intraepithelial lesion or malignancy (NILM)   HPVHIGH Negative  06/12/2023 1423   ADEQPAP  06/12/2023 1423    Satisfactory for evaluation; transformation zone component PRESENT.    Obstetric History OB History  Gravida Para Term Preterm AB Living  3 2 1 1  2   SAB IAB Ectopic Multiple Live Births     0 2    # Outcome Date GA Lbr Len/2nd Weight Sex Type Anes PTL Lv  3 Current           2 Term 05/02/20 [redacted]w[redacted]d 06:00 / 00:36 6 lb 4.2 oz (2.84 kg) M Vag-Spont EPI  LIV  1 Preterm 11/12/18 [redacted]w[redacted]d  4 lb 9.4 oz (2.08 kg) M  Gen  LIV    Past Medical History:  Diagnosis Date   Anxiety    Asthma    History of blood transfusion 10/2018   after Cesarean section 2020   Obese    Placenta previa antepartum in second trimester 07/30/2018   Placental abruption in third trimester 11/12/2018   Pregnancy 09/19/2018   Supervision of normal first teen pregnancy in first trimester 05/03/2018   Vaginal bleeding in pregnancy, third trimester     Past Surgical History:  Procedure Laterality Date   CESAREAN SECTION N/A 11/12/2018   Procedure: CESAREAN SECTION;  Surgeon: Connell Davies, MD;  Location: [facility] ORS;  Service: Obstetrics;  Laterality: N/A;  STAT   OTHER SURGICAL HISTORY  2025   Kidney Stent, placed and removed   TONSILLECTOMY     23 yrs old    Medications Ordered Prior to Encounter[1]  Allergies[2]  Social History   Socioeconomic History   Marital status: Significant Other    Spouse name: Not on file   Number of children: 2   Years of education: 12   Highest education level: GED or equivalent  Occupational History   Occupation: UNEMPLOYEED  Tobacco Use   Smoking status: Never   Smokeless tobacco: Never  Vaping Use   Vaping status: Former  Substance and Sexual Activity   Alcohol use: Never   Drug use: Never   Sexual activity: Not Currently    Partners: Male    Birth control/protection: None  Other Topics Concern   Not on file  Social History Narrative   Not on file   Social Drivers of Health   Tobacco Use: Low Risk (06/20/2024)    Patient History    Smoking Tobacco Use: Never    Smokeless Tobacco Use: Never    Passive Exposure: Not on file  Recent Concern: Tobacco Use - Medium Risk (03/25/2024)   Received from [facility]   Patient History    Smoking Tobacco Use: Former    Smokeless Tobacco Use: Never    Passive Exposure: Past  Physicist, Medical Strain: Low Risk (06/20/2024)   Overall Financial Resource Strain (CARDIA)    Difficulty of Paying Living Expenses: Not hard at all  Food Insecurity: No Food Insecurity (06/20/2024)   Epic    Worried About Radiation Protection Practitioner of Food in the Last Year: Never true    Ran Out of Food in the Last Year: Never true  Transportation Needs: No Transportation Needs (06/20/2024)   Epic    Lack of Transportation (Medical): No    Lack of Transportation (Non-Medical): No  Physical Activity: Sufficiently Active (06/20/2024)   Exercise Vital Sign    Days of Exercise per Week: 6 days    Minutes of Exercise per Session: 120 min  Stress: No Stress Concern Present (06/20/2024)   [employer] of Occupational Health - Occupational Stress Questionnaire    Feeling of Stress: Not at all  Social Connections: Moderately Integrated (06/20/2024)   Social Connection and Isolation Panel    Frequency of Communication with Friends and Family: Three times a week    Frequency of Social Gatherings with Friends and Family: Once a week    Attends Religious Services: More than 4 times per year    Active Member of [employer] or Organizations: No    Attends Engineer, Structural: Not on file    Marital Status: Living with partner  Intimate Partner Violence: Not At Risk (06/20/2024)   Epic    Fear of Current or Ex-Partner: No    Emotionally Abused: No    Physically Abused: No    Sexually Abused: No  Depression (PHQ2-9): Not on file  Alcohol Screen: Not on file  Housing: Low Risk (06/20/2024)   Epic    Unable to Pay for Housing in the Last Year: No    Number of Times Moved in the Last Year: 1     Homeless in the Last Year: No  Utilities: Not At Risk (06/20/2024)   Epic    Threatened with loss of utilities: No  Health Literacy: Adequate Health Literacy (06/20/2024)   B1300 Health Literacy    Frequency of need for help with medical instructions: Never    Family History  Problem Relation Age of Onset   Diabetes Maternal Grandmother    Healthy Mother    Healthy Father    Breast cancer Neg Hx     The following portions of the patient's history were reviewed and updated as appropriate: allergies, current medications, past OB history, past medical history, past surgical history, past family history, past social history, and problem list.  Constitutional: Denied constitutional symptoms, night sweats,  recent illness, fatigue, fever, insomnia and weight loss.  Eyes: Denied eye symptoms, eye pain, photophobia, vision change and visual disturbance.  Ears/Nose/Throat/Neck: Denied ear, nose, throat or neck symptoms, hearing loss, nasal discharge, sinus congestion and sore throat.  Cardiovascular: Denied cardiovascular symptoms, arrhythmia, chest pain/pressure, edema, exercise intolerance, orthopnea and palpitations.  Respiratory: Denied pulmonary symptoms, asthma, pleuritic pain, productive sputum, cough, dyspnea and wheezing.  Gastrointestinal: Denied gastro-esophageal reflux, melena, nausea and vomiting.  Genitourinary: Denied genitourinary symptoms including symptomatic vaginal discharge, pelvic relaxation issues, and urinary complaints.  Musculoskeletal: Denied musculoskeletal symptoms, stiffness, swelling, muscle weakness and myalgia. backache  Dermatologic: Denied dermatology symptoms, rash and scar.  Neurologic: Denied neurology symptoms, dizziness, headache, neck pain and syncope.  Psychiatric: Denied psychiatric symptoms, anxiety and depression.  Endocrine: Denied endocrine symptoms including hot flashes and night sweats.     OBJECTIVE: Initial Physical Exam (New OB) BP  115/76   Pulse (!) 108   Wt 265 lb 12.8 oz (120.6 kg)   LMP 04/15/2024 (Exact Date)   BMI 48.62 kg/m  Physical Exam Vitals reviewed.  Constitutional:      General: She is not in acute distress.    Appearance: Normal appearance.  HENT:     Head: Normocephalic.  Neck:     Thyroid : No thyroid  mass or thyromegaly.  Cardiovascular:     Rate and Rhythm: Normal rate and regular rhythm.     Heart sounds: Normal heart sounds.  Pulmonary:     Effort: Pulmonary effort is normal.     Breath sounds: Normal breath sounds.  Abdominal:     Palpations: Abdomen is soft.     Tenderness: There is no abdominal tenderness.  Genitourinary:    General: Normal vulva.  Musculoskeletal:     Cervical back: Neck supple. No tenderness.  Skin:    General: Skin is warm and dry.  Neurological:     General: No focal deficit present.     Mental Status: She is alert and oriented to person, place, and time.  Psychiatric:        Mood and Affect: Mood and affect normal.        Behavior: Behavior normal. Behavior is cooperative.     Fetal Heart Rate (bpm): 155  ASSESSMENT: Normal pregnancy   PLAN: Routine prenatal care. We discussed an overview of prenatal care and when to call. Reviewed diet, exercise, and weight gain recommendations in pregnancy. Discussed benefits of breastfeeding and lactation resources at [facility]. I reviewed labs and answered all questions. Start daily ASA for pre-eclampsia prevention TOLAC consent in early third trimester MFM for anatomy given class III obesity, hx of CD for abruption & previa 2nd trimester early anatomy ordered due to hx of complications Intends to breastfeed Desires carrier screening in addition to NIPT today Reviewed plan of care for obesity in pregnancy with 3rd trimester growth q4w, NSTs at 34-36w, delivery by EDD, anesthesia consult given pre-pregnancy BMI >45.  1. Supervision of other normal pregnancy, antepartum (Primary) - PANORAMA PRENATAL TEST -  HORIZON Basic Panel - Cervicovaginal ancillary only - NOB Panel; Future - Culture, OB Urine - Monitor Drug Profile 14(MW) - Nicotine screen, urine - Urinalysis, Routine w reflex microscopic - Comprehensive metabolic panel - Hemoglobin A1c - Hgb Fractionation Cascade - Protein / creatinine ratio, urine - TSH + free T4 - Ambulatory referral to Anesthesiology - NOB Panel  2. Screen for STD (sexually transmitted disease) - Cervicovaginal ancillary only - NOB Panel; Future - Culture, OB Urine - Monitor Drug Profile  14(MW) - Nicotine screen, urine - Urinalysis, Routine w reflex microscopic - Comprehensive metabolic panel - Hemoglobin A1c - Hgb Fractionation Cascade - Protein / creatinine ratio, urine - TSH + free T4 - Ambulatory referral to Anesthesiology - NOB Panel  3. Screening, anemia, deficiency, iron  - NOB Panel; Future - Culture, OB Urine - Monitor Drug Profile 14(MW) - Nicotine screen, urine - Urinalysis, Routine w reflex microscopic - Comprehensive metabolic panel - Hemoglobin A1c - Hgb Fractionation Cascade - Protein / creatinine ratio, urine - TSH + free T4 - Ambulatory referral to Anesthesiology - NOB Panel  4. Screening for diabetes mellitus - NOB Panel; Future - Culture, OB Urine - Monitor Drug Profile 14(MW) - Nicotine screen, urine - Urinalysis, Routine w reflex microscopic - Comprehensive metabolic panel - Hemoglobin A1c - Hgb Fractionation Cascade - Protein / creatinine ratio, urine - TSH + free T4 - Ambulatory referral to Anesthesiology - NOB Panel  5. Genetic screening - PANORAMA PRENATAL TEST - HORIZON Basic Panel - NOB Panel; Future - Culture, OB Urine - Monitor Drug Profile 14(MW) - Nicotine screen, urine - Urinalysis, Routine w reflex microscopic - Comprehensive metabolic panel - Hemoglobin A1c - Hgb Fractionation Cascade - Protein / creatinine ratio, urine - TSH + free T4 - Ambulatory referral to Anesthesiology - NOB  Panel  6. [redacted] weeks gestation of pregnancy - PANORAMA PRENATAL TEST - HORIZON Basic Panel - Cervicovaginal ancillary only - NOB Panel; Future - Culture, OB Urine - Monitor Drug Profile 14(MW) - Nicotine screen, urine - Urinalysis, Routine w reflex microscopic - Comprehensive metabolic panel - Hemoglobin A1c - Hgb Fractionation Cascade - Protein / creatinine ratio, urine - TSH + free T4 - Ambulatory referral to Anesthesiology - NOB Panel  7. Encounter for drug screening - NOB Panel; Future - Culture, OB Urine - Monitor Drug Profile 14(MW) - Nicotine screen, urine - Urinalysis, Routine w reflex microscopic - Comprehensive metabolic panel - Hemoglobin A1c - Hgb Fractionation Cascade - Protein / creatinine ratio, urine - TSH + free T4 - Ambulatory referral to Anesthesiology - NOB Panel  8. History of placental abruption - US  OB Comp Less 14 Wks; Future - US  MFM OB DETAIL +14 WK; Future  9. History of C-section - US  OB Comp Less 14 Wks; Future - US  MFM OB DETAIL +14 WK; Future  10. History of placenta previa - US  OB Comp Less 14 Wks; Future - US  MFM OB DETAIL +14 WK; Future  11. Encounter for routine screening for malformation using ultrasonics - US  MFM OB DETAIL +14 WK; Future  12. Supervision of high risk pregnancy, antepartum  13. Adult BMI 45.0-49.9 kg/sq m (HCC)   Harlene LITTIE Cisco, CNM      [1]  Current Outpatient Medications on File Prior to Visit  Medication Sig Dispense Refill   Prenatal Vit-Fe Fumarate-FA (PRENATAL PO) Take by mouth.     Doxylamine -Pyridoxine  (DICLEGIS ) 10-10 MG TBEC Take 2 tablets by mouth at bedtime. If symptoms persist, add one tablet in the morning and one in the afternoon (Patient not taking: Reported on 07/25/2024) 60 tablet 1   ondansetron  (ZOFRAN -ODT) 4 MG disintegrating tablet Take 4 mg by mouth every 8 (eight) hours as needed. (Patient not taking: Reported on 07/25/2024)     No current facility-administered medications on  file prior to visit.  [2] No Known Allergies

## 2024-07-26 LAB — PROTEIN / CREATININE RATIO, URINE
Creatinine, Urine: 178.1 mg/dL
Protein, Ur: 11.4 mg/dL
Protein/Creat Ratio: 64 mg/g{creat} (ref 0–200)

## 2024-07-26 LAB — MICROSCOPIC EXAMINATION
Casts: NONE SEEN /LPF
Epithelial Cells (non renal): >10 /HPF — AB (ref 0–10)

## 2024-07-26 LAB — URINALYSIS, ROUTINE W REFLEX MICROSCOPIC
Bilirubin, UA: NEGATIVE
Glucose, UA: NEGATIVE
Nitrite, UA: NEGATIVE
RBC, UA: NEGATIVE
Specific Gravity, UA: 1.026 (ref 1.005–1.030)
Urobilinogen, Ur: 1 mg/dL (ref 0.2–1.0)
pH, UA: 7 (ref 5.0–7.5)

## 2024-07-27 ENCOUNTER — Encounter: Payer: Self-pay | Admitting: Certified Nurse Midwife

## 2024-07-27 LAB — COMPREHENSIVE METABOLIC PANEL WITH GFR
ALT: 15 IU/L (ref 0–32)
AST: 14 IU/L (ref 0–40)
Albumin: 3.9 g/dL — ABNORMAL LOW (ref 4.0–5.0)
Alkaline Phosphatase: 62 IU/L (ref 41–116)
BUN/Creatinine Ratio: 18 (ref 9–23)
BUN: 10 mg/dL (ref 6–20)
Bilirubin Total: 0.2 mg/dL (ref 0.0–1.2)
CO2: 18 mmol/L — ABNORMAL LOW (ref 20–29)
Calcium: 9.5 mg/dL (ref 8.7–10.2)
Chloride: 103 mmol/L (ref 96–106)
Creatinine, Ser: 0.57 mg/dL (ref 0.57–1.00)
Globulin, Total: 2.3 g/dL (ref 1.5–4.5)
Glucose: 79 mg/dL (ref 70–99)
Potassium: 4.6 mmol/L (ref 3.5–5.2)
Sodium: 136 mmol/L (ref 134–144)
Total Protein: 6.2 g/dL (ref 6.0–8.5)
eGFR: 132 mL/min/1.73

## 2024-07-27 LAB — CBC/D/PLT+RPR+RH+ABO+RUBIGG...
Antibody Screen: NEGATIVE
Basophils Absolute: 0 x10E3/uL (ref 0.0–0.2)
Basos: 0 %
EOS (ABSOLUTE): 0.1 x10E3/uL (ref 0.0–0.4)
Eos: 1 %
HCV Ab: NONREACTIVE
HIV Screen 4th Generation wRfx: NONREACTIVE
Hematocrit: 40.4 % (ref 34.0–46.6)
Hemoglobin: 13 g/dL (ref 11.1–15.9)
Hepatitis B Surface Ag: NEGATIVE
Immature Grans (Abs): 0 x10E3/uL (ref 0.0–0.1)
Immature Granulocytes: 0 %
Lymphocytes Absolute: 1.9 x10E3/uL (ref 0.7–3.1)
Lymphs: 24 %
MCH: 28.9 pg (ref 26.6–33.0)
MCHC: 32.2 g/dL (ref 31.5–35.7)
MCV: 90 fL (ref 79–97)
Monocytes Absolute: 0.3 x10E3/uL (ref 0.1–0.9)
Monocytes: 4 %
Neutrophils Absolute: 5.3 x10E3/uL (ref 1.4–7.0)
Neutrophils: 71 %
Platelets: 280 x10E3/uL (ref 150–450)
RBC: 4.5 x10E6/uL (ref 3.77–5.28)
RDW: 13.2 % (ref 11.7–15.4)
RPR Ser Ql: NONREACTIVE
Rh Factor: POSITIVE
Rubella Antibodies, IGG: 1.37 {index}
Varicella zoster IgG: REACTIVE
WBC: 7.6 x10E3/uL (ref 3.4–10.8)

## 2024-07-27 LAB — HGB FRACTIONATION CASCADE
Hgb A2: 2.8 % (ref 1.8–3.2)
Hgb A: 97.2 % (ref 96.4–98.8)
Hgb F: 0 % (ref 0.0–2.0)
Hgb S: 0 %

## 2024-07-27 LAB — CULTURE, OB URINE

## 2024-07-27 LAB — TSH+FREE T4
Free T4: 1.01 ng/dL (ref 0.82–1.77)
TSH: 3.37 u[IU]/mL (ref 0.450–4.500)

## 2024-07-27 LAB — HEMOGLOBIN A1C
Est. average glucose Bld gHb Est-mCnc: 100 mg/dL
Hgb A1c MFr Bld: 5.1 % (ref 4.8–5.6)

## 2024-07-27 LAB — HCV INTERPRETATION

## 2024-07-27 LAB — URINE CULTURE, OB REFLEX

## 2024-07-27 MED ORDER — ASPIRIN 81 MG PO TBEC: 81.0000 mg | DELAYED_RELEASE_TABLET | Freq: Every day | ORAL | 2 refills | Status: AC

## 2024-07-27 NOTE — Patient Instructions (Signed)
 First Trimester of Pregnancy  The first trimester of pregnancy starts on the first day of your last monthly period until the end of week 13. This is months 1 through 3 of pregnancy. A week after a sperm fertilizes an egg, the egg will implant into the wall of the uterus and begin to develop into a baby. Body changes during your first trimester Your body goes through many changes during pregnancy. The changes usually return to normal after your baby is born. Physical changes Your breasts may grow larger and may hurt. The area around your nipples may get darker. Your periods will stop. Your hair and nails may grow faster. You may pee more often. Health changes You may tire easily. Your gums may bleed and may be sensitive when you brush and floss. You may not feel hungry. You may have heartburn. You may throw up or feel like you may throw up. You may want to eat some foods, but not others. You may have headaches. You may have trouble pooping (constipation). Other changes Your emotions may change from day to day. You may have more dreams. Follow these instructions at home: Medicines Talk to your health care provider if you're taking medicines. Ask if the medicines are safe to take during pregnancy. Your provider may change the medicines that you take. Do not take any medicines unless told to by your provider. Take a prenatal vitamin that has at least 600 micrograms (mcg) of folic acid. Do not use herbal medicines, illegal substances, or medicines that are not approved by your provider. Eating and drinking While you're pregnant your body needs extra food for your growing baby. Talk with your provider about what to eat while pregnant. Activity Most women are able to exercise during pregnancy. Exercises may need to change as your pregnancy goes on. Talk to your provider about your activities and exercise routines. Relieving pain and discomfort Wear a good, supportive bra if your breasts  hurt. Rest with your legs raised if you have leg cramps or low back pain. Safety Wear your seatbelt at all times when you're in a car. Talk to your provider if someone hits you, hurts you, or yells at you. Talk with your provider if you're feeling sad or have thoughts of hurting yourself. Lifestyle Certain things can be harmful while you're pregnant. Follow these rules: Do not use hot tubs, steam rooms, or saunas. Do not douche. Do not use tampons or scented pads. Do not drink alcohol,smoke, vape, or use products with nicotine or tobacco in them. If you need help quitting, talk with your provider. Avoid cat litter boxes and soil used by cats. These things carry germs that can cause harm to your pregnancy and your baby. General instructions Keep all follow-up visits. It helps you and your unborn baby stay as healthy as possible. Write down your questions. Take them to your visits. Your provider will: Talk with you about your overall health. Give you advice or refer you to specialists who can help with different needs, including: Prenatal education classes. Mental health and counseling. Foods and healthy eating. Ask for help if you need help with food. Call your dentist and ask to be seen. Brush your teeth with a soft toothbrush. Floss gently. Where to find more information American Pregnancy Association: americanpregnancy.org [employer]: acog.org Office on [employer] Health: TravelLesson.ca Contact a health care provider if: You feel dizzy, faint, or have a fever. You vomit or have watery poop (diarrhea) for 2  days or more. You have abnormal discharge or bleeding from your vagina. You have pain when you pee or your pee smells bad. You have cramps, pain, or pressure in your belly area. Get help right away if: You have trouble breathing or chest pain. You have any kind of injury, such as from a fall or a car crash. These symptoms may be an  emergency. Get help right away. Call 911. Do not wait to see if the symptoms will go away. Do not drive yourself to the hospital. This information is not intended to replace advice given to you by your health care provider. Make sure you discuss any questions you have with your health care provider. Document Revised: 04/19/2023 Document Reviewed: 11/17/2022 Elsevier Patient Education  2024 [employer] Gain During Pregnancy Gaining some weight during pregnancy is normal and healthy. The amount of weight you should gain depends on your health and weight before pregnancy. Talk with your health care provider to find out the right amount of weight gain for you. The suggested weight gain is based on your body mass index, or BMI. Here's a general guide based on your weight before pregnancy: If you're underweight or have a BMI less than 18.5, you should gain 28-40 lb (13-18 kg). If you're at a normal weight with a BMI of 18.5-24.9, you should gain 25-35 lb (11-16 kg). If you're overweight with a BMI of 25-29.9, you should gain 15-25 lb (7-11 kg). If you're obese with a BMI of 30 or higher, you should gain 11-20 lb (5-9 kg). Your provider may suggest that you try to gain weight slower or gain more weight based on what's best for your baby and your health. What are the risks of unhealthy weight gain for me? Gaining too much weight during pregnancy can lead to: A type of diabetes that can happen during pregnancy. This is called gestational diabetes. Hypertensive disorders of pregnancy. These are problems that cause high blood pressure. Having a difficult delivery or a C-section. What are the risks of unhealthy weight gain for my baby? Gaining too little weight during pregnancy can lead to: Loss of the pregnancy. An early (preterm) birth. Your baby not growing normally. Your baby having a low weight at birth. Gaining too much weight during pregnancy can lead to: Your baby growing  larger than normal during pregnancy. Your baby having an increased risk of obesity. What actions can I take to gain a healthy amount of weight during pregnancy? Nutrition  Eat healthy foods. Every day, try to eat: Fruits and vegetables of various colors and kinds. Whole grains, like whole-wheat breads and oatmeal. Low-fat dairy foods such as yogurt, milk, and cheese. Or try non-dairy alternatives from soy or almond. Protein-rich foods, like lean meat, chicken, eggs, and beans. Avoid foods that are fried or have a lot of fat, salt, or sugar. Drink more fluids as told. Choose healthy snacks and drinks: Drink water. Avoid soda, sports drinks, and juices that have added sugar. Avoid drinks with caffeine, such as coffee and energy drinks. Eat snacks that are high in protein, such as nuts, protein bars, and low-fat yogurt. Carry snacks with you that don't need refrigeration, such as trail mix, an apple, or a bar. If you need help improving your diet, work with your provider or an expert in healthy eating called a dietitian. Activity  Exercise regularly, as told by your provider. If you were active before you became pregnant, you may be able to continue your  regular fitness activities. If you were not active before pregnancy, you may slowly build up to exercising for 30 or more minutes on most days of the week. This may include walking, swimming, or yoga. Ask your provider what activities are safe for you. Follow these instructions at home: Take medicines only as told. Take all prenatal supplements as told. Keep track of your weight gain during pregnancy. Keep all prenatal health care visits. These visits are a good time to discuss your weight gain. Your provider will check on your health and the health of your baby. Where to find support If you have questions or need help learning about healthy weight gain during pregnancy, these people may help: Your health care provider. A  dietitian. Where to find more information To learn more: Go to bitchilla.com. Click Search and type weight gain. Find the link you need. Contact a health care provider if: You're not able to eat or drink for longer than 24 hours. You can't afford food or have trouble getting regular meals. This information is not intended to replace advice given to you by your health care provider. Make sure you discuss any questions you have with your health care provider. Document Revised: 06/08/2023 Document Reviewed: 06/08/2023 Elsevier Patient Education  2025 [employer]. Genetic Testing During Pregnancy: What to Know Genetic testing is done when you're pregnant to check if your baby might have a congenital condition. A congenital condition is something a baby is born with, also called a birth condition. These conditions can happen when genes or chromosomes are not normal. Genes are tiny parts in your body that make up chromosomes. Chromosomes are groups of many genes. Together, they tell your body how to look and work. Genes are passed down from parents to their baby. Why is genetic testing done during pregnancy? Genetic testing allows you to: Talk about your test results and future plans with your health care team. Plan for a baby that may be born with a congenital condition. Make plans with your health care team in case your baby needs special care before or after birth. Think about your options regarding whether you want to continue with the pregnancy. Types of genetic tests A genetic test can be a screening test or a diagnostic test. Screening tests     Screening tests are used to check the risk of your baby having a congenital condition. They don't show if your baby actually has the condition. More testing will be needed to know for sure. Screening tests are recommended for all pregnant people. Screening tests will not hurt your baby. Types of screening tests include: Carrier  screening. The parents' blood or saliva is tested to check for genes that aren't normal. These genes can be passed to the baby. If both parents have the gene, the baby is at risk. First-trimester screening. This includes a maternal blood test and an ultrasound of your baby. This test checks for a risk of conditions related to chromosomes. It also looks for problems with your baby's heart, belly, or bones. Second-trimester screening. This may include a maternal blood test and an ultrasound of your baby. This test checks for the risk of conditions related to chromosomes. It also looks for problems with many parts of your baby's body. These include the brain, nose, mouth, spine, heart, and arms or legs. Some people may only have an ultrasound and not have a blood test. Combined or sequential screening. This looks at the results from the blood tests in the  first and second trimesters, along with the findings of the first-trimester ultrasound. It helps to tell you more about your baby. This type of testing may be more accurate than just doing screening in the first or second trimester by itself. Cell-free DNA testing. During pregnancy, cells from your placenta get into your blood, which is normal. This test is a blood test that looks at those cells. It's done after 10 weeks of pregnancy. It can be used to check for the risk of conditions caused by having too many chromosomes or an abnormal number of sex chromosomes.  Diagnostic tests Diagnostic tests are done only if your baby is known to be at risk of having a congenital condition. These tests check the cells from your baby to diagnose a condition. Examples of these tests include: Chorionic villus sampling (CVS). This is a procedure where cells are taken from the placenta for testing. To do this, a needle is put into your belly using guided ultrasound. Amniocentesis. This is a procedure where amniotic fluid is removed from the sac around your baby. The cells  from the placenta or amniotic fluid are tested for chromosomes that are not normal. What do the results mean? For a screening test: If your results are negative, it means that your baby is most likely not at a higher risk for a condition. There's still a small chance your baby could have a condition. If your results are positive, it means that your baby's risk for a condition is higher than normal. Your health care provider may want you to have a diagnostic test. For a diagnostic test: If the result is negative, it's not likely that your baby will have a condition. If the test is positive, your baby most likely has a condition. Talk with your provider about what your results mean and what your options are. Questions to ask your health care provider Talk with your provider about the conditions that run in your family. Ask these questions: Is my baby at risk for a congenital condition? What are the benefits of having genetic screening? Should I meet with a genetic counselor? Should my partner or other members of my family be tested? What tests are best for me and my baby? How much do the tests cost? Will my insurance cover the testing? What are the risks of each test? This information is not intended to replace advice given to you by your health care provider. Make sure you discuss any questions you have with your health care provider. Document Revised: 06/07/2023 Document Reviewed: 06/07/2023 Elsevier Patient Education  2025 [employer]. Pregnancy: Healthy Eating While you're pregnant, your body needs extra nutrition for your growing baby. You also need more vitamins and minerals, such as folic acid, calcium, iron, and vitamin D. Eating a balanced diet is important for both you and your baby. Your need for extra calories will change during pregnancy. During the first 3 months of pregnancy, called the first trimester, you don't need more calories. During the second trimester, you'll need  about 340 extra calories a day. During the third trimester, you'll need about 450 extra calories a day. If you're carrying more than one baby, talk with your health care provider or a dietitian to learn more about your specific eating needs. What are tips for eating healthy during pregnancy? Meal planning  Eating smaller meals throughout the day may help manage some side effects common in pregnancy, like heartburn and reflux. Eat a variety of foods. Be sure to include  many types of fruits and vegetables. Two or more servings of fish are recommended each week. Choose fish that are lower in mercury, such as salmon and pollock. Limit foods that have empty calories. These are foods that have little nutritional value, such as sweets, desserts, candies, and drinks with sugar in them. Drinks that have caffeine are OK to drink, but it's better to avoid caffeine. Limit your total caffeine intake to less than 200 mg each day, or the limit you're told by your provider. Be aware that 200 mg of caffeine is 12 oz or 355 mL of coffee, tea, or soda. General information Take a prenatal vitamin to help meet your vitamin and mineral needs during pregnancy. This includes your need for folic acid, iron, calcium, and vitamin D. Do not try to lose weight or go on a diet during pregnancy. Food safety  Wash your hands before you eat and after you prepare raw meat. Wash all fruits and vegetables well before peeling or eating. Make sure that all meats, poultry, and eggs are cooked to food-safe temperatures or well-done. Taking these actions can help keep your food safe and protect you and your baby from dangerous food illnesses. Ask your provider for more information. What foods should I eat? Fruits All fruits. Eat a variety of colors and types of fruit. Remember to wash your fruits well before peeling or eating. Vegetables All vegetables. Eat a variety of colors and types of vegetables. Remember to wash your  vegetables well before peeling or eating. Grains All grains. Choose whole grains, such as whole-wheat bread, oatmeal, or brown rice. Meats and other protein foods Lean meats, including chicken, [place], and lean cuts of beef, veal, or pork. Fish that is higher in omega-3 fatty acids and lower in mercury, such as salmon, herring, mussels, trout, sardines, pollock, shrimp, crab, and lobster. Tofu. Tempeh. Beans. Eggs. Peanut butter and other nut butters. Dairy Pasteurized milk and milk alternatives, such as soy milk. Pasteurized yogurt and pasteurized cheese. Cottage cheese. Sour cream. Beverages Water. Juices that contain 100% fruit juice or vegetable juice. Caffeine-free teas and decaffeinated coffee. Fats and oils Fats and oils are OK to include in moderation. Sweets and desserts Sweets and desserts are OK to include in moderation. Seasoning and other foods All pasteurized condiments. The items listed above may not be all the foods and drinks you can have. Talk with a dietitian to learn more. What does 340 extra calories look like? Healthy snacks that give you 340 more calories a day could be: Peanut butter and jelly with milk: 8 oz (237 mL) of low-fat milk. Peanut butter and jelly sandwich made with: 1 slice of whole-wheat bread. 2 teaspoons (10 g) of peanut butter. Yogurt and berries: 1 cup (245 g) of [place] yogurt. 1 cup (150 g) of berries. 2 tablespoons (30 g) of chopped nuts, such as almonds or walnuts. Avocado toast: 1 slice of whole-wheat bread. 1/2 medium avocado (70 g). 1 large egg (50 g). What foods should I avoid? Fruits Raw (unpasteurized) fruit juices. Vegetables Unpasteurized vegetable juices. Meats and other protein foods Precooked or cured meat, such as bologna, hot dogs, sausages, or meat loaves. (If you must eat those meats, reheat them until they are steaming hot.) Refrigerated pate, meat spreads from a meat counter, or smoked seafood that's found in the  refrigerated section of a store. Raw or undercooked meats, poultry, and eggs. Raw fish, such as sushi or sashimi. Fish that have high mercury content, such as tilefish,  shark, swordfish, and king mackerel. Dairy Unpasteurized or raw milk and any foods that are made from them. Some of these may be: Homemade yogurts or puddings. Soft cheeses such as: Feta. Queso blanco or fresco. Pharmacist, hospital or [place]. Blue-veined cheeses. Some of these types of cheeses may be made with pasteurized milk. Check the label. If pasteurized milk is used, they are OK to eat during pregnancy. Deli foods Premade foods from a store or deli, like chicken salad, coleslaw, or egg salad. These are riskier for food illness than fresh or homemade salads. Beverages Alcohol. Sugar-sweetened drinks, such as sodas or teas. Energy drinks. Seasoning and other foods Homemade fermented foods and drinks, such as: Pickles. Sauerkraut. Kombucha. Store-bought pasteurized versions of these are OK. The items listed above may not be all the foods and drinks you should avoid. Talk with a dietitian to learn more. Where to find more information To learn more, go to: Centers for Disease Control and Prevention at TonerPromos.no. Click Search and type food choices for pregnancy. Find the link you need. MyPlate at http://pittman-dennis.biz/. This information is not intended to replace advice given to you by your health care provider. Make sure you discuss any questions you have with your health care provider. Document Revised: 07/04/2023 Document Reviewed: 07/04/2023 Elsevier Patient Education  2025 [employer]. Second Trimester of Pregnancy  The second trimester of pregnancy is from week 14 through week 27. This is months 4 through 6 of pregnancy. During the second trimester: Morning sickness is less or has stopped. You may have more energy. You may feel hungry more often. At this time, your unborn baby is growing very fast. At the end  of the sixth month, the unborn baby may be up to 12 inches long and weigh about 1 pounds. You will likely start to feel the baby move between 16 and 20 weeks of pregnancy. Body changes during your second trimester Your body continues to change during this time. The changes usually go away after your baby is born. Physical changes You will gain more weight. Your belly will get bigger. You may begin to get stretch marks on your hips, belly, and breasts. Your breasts will keep growing and may hurt. You may get dark spots or blotches on your face. A dark line from your belly button to the pubic area may appear. This line is called linea nigra. Your hair may grow faster and get thicker. Health changes You may have headaches. You may have heartburn. You may pee more often. You may have swollen, bulging veins (varicose veins). You may have trouble pooping (constipation), or swollen veins in the butt that can itch or get painful (hemorrhoids). You may have back pain. This is caused by: Weight gain. Pregnancy hormones that are relaxing the joints in your pelvis. Follow these instructions at home: Medicines Talk to your health care provider if you're taking medicines. Ask if the medicines are safe to take during pregnancy. Your provider may change the medicines that you take. Do not take any medicines unless told to by your provider. Take a prenatal vitamin that has at least 600 micrograms (mcg) of folic acid. Do not use herbal medicines, illegal drugs, or medicines that are not approved by your provider. Eating and drinking While you're pregnant your body needs extra food for your growing baby. Talk with your provider about what to eat while pregnant. Activity Most women are able to exercise during pregnancy. Exercises may need to change as your pregnancy goes on. Talk to  your provider about your activities and exercise routines. Relieving pain and discomfort Wear a good, supportive bra if  your breasts hurt. Rest with your legs raised if you have leg cramps or low back pain. Take warm sitz baths to soothe pain from hemorrhoids. Use hemorrhoid cream if your provider says it's okay. Do not douche. Do not use tampons or scented pads. Do not use hot tubs, steam rooms, or saunas. Safety Wear your seatbelt at all times when you're in a car. Talk to your provider if someone hits you, hurts you, or yells at you. Talk with your provider if you're feeling sad or have thoughts of hurting yourself. Lifestyle Certain things can be harmful while you're pregnant. It's best to avoid the following: Do not drink alcohol,smoke, vape, or use products with nicotine or tobacco in them. If you need help quitting, talk with your provider. Avoid cat litter boxes and soil used by cats. These things carry germs that can cause harm to your pregnancy and your baby. General instructions Keep all follow-up visits. It helps you and your unborn baby stay as healthy as possible. Write down your questions. Take them to your prenatal visits. Your provider will: Talk with you about your overall health. Give you advice or refer you to specialists who can help with different needs, including: Prenatal education classes. Mental health and counseling. Foods and healthy eating. Ask for help if you need help with food. Where to find more information American Pregnancy Association: americanpregnancy.org [employer]: acog.org Office on [employer] Health: TravelLesson.ca Contact a health care provider if: You have a headache that does not go away when you take medicine. You have any of these problems: You can't eat or drink. You throw up or feel like you may throw up. You have watery poop (diarrhea) for 2 days or more. You have pain when you pee or your pee smells bad. You have been sick for 2 days or more and are not getting better. Contact your provider right away if: You  have any of these coming from your vagina: Abnormal discharge. Bad-smelling fluid. Bleeding. Your baby is moving less than usual. You have contractions, belly cramping, or have pain in your pelvis or lower back. You have symptoms of high blood pressure or preeclampsia. These include: A severe, throbbing headache that does not go away. Sudden or extreme swelling of your face, hands, legs, or feet. Vision problems: You see spots. You have blurry vision. Your eyes are sensitive to light. If you can't reach the provider, go to an urgent care or emergency room. Get help right away if: You faint, become confused, or can't think clearly. You have chest pain or trouble breathing. You have any kind of injury, such as from a fall or a car crash. These symptoms may be an emergency. Call 911 right away. Do not wait to see if the symptoms will go away. Do not drive yourself to the hospital. This information is not intended to replace advice given to you by your health care provider. Make sure you discuss any questions you have with your health care provider. Document Revised: 04/19/2023 Document Reviewed: 11/17/2022 Elsevier Patient Education  2024 Elsevier Inc. Benefits of Breastfeeding Breastfeeding has many health benefits for babies and their breastfeeding parents. Breast milk is the best form of nutrition for babies. Breastfeeding is recommended until a child is 68 years old or older, as long as this is wanted by both the breastfeeding parent and child. Breastfeeding  can be challenging, especially during the first few weeks after your baby is born. Ask your health care provider or breastfeeding specialist (lactation consultant) for more information and help if needed. Asking for help early can provide the support you need for successful breastfeeding. Breastfeeding is not the only way to feed your baby breast milk. Some parents choose not to feed their babies directly from the breast. Instead,  they pump and bottle-feed their babies expressed breast milk. How do breastfeeding and pumping benefit me? Breastfeeding and pumping may: Help to create a bond between you and your baby. Slow bleeding after you give birth. Help your uterus return to its size before pregnancy. Help you lose the weight gained during pregnancy. Lower your risk of developing type 2 diabetes, weak bones (osteoporosis), rheumatoid arthritis, cardiovascular disease, and some forms of cancer later in life. How does breast milk benefit my baby? Nutrients in breast milk are better for your baby compared to nutrients in infant formula. Breast milk is easier for your baby to digest. The first milk (colostrum)helps your baby's digestive system function better. Breast milk lowers the risk of problems with the stomach and intestines. This includes necrotizing enterocolitis (NEC) in newborns. NEC is the inflammation and death of tissue in the intestine. Babies born early (premature) are at a higher risk of developing NEC. As your baby grows, your body changes the nutrients in your breast milk to meet your baby's needs. Breast milk improves your baby's brain development. Breast milk lowers your baby's risk for sudden infant death syndrome (SIDS). Breast milk can lower your baby's risk of: Ear infections. Infections of the nose, throat, or airways (respiratory infections). Allergies. Obesity. Diabetes. What are breast milk antibodies? Breast milk antibodies are substances that are part of the body's disease-fighting system (immune system). Antibodies help your baby fight off infections. Breast milk antibodies protect your baby from illnesses that you have: Had yourself. Been vaccinated against. Been exposed to while breastfeeding or pumping. What are other benefits of breastfeeding and bottle-feeding expressed breast milk? Breast milk is free. Breastfeeding may be convenient. In an emergency situation, formula shortage,  or natural disaster, you will be able to feed your baby without the need for safe water or infant formula. If you are exclusively pumping, manual breast pumps are available to express breast milk. You will need to boil pump parts to keep them clean between uses. If you do not have access to clean water or a manual breast pump, you can hand express your milk. Do this by compressing and massaging the milk ducts of each breast and catching the breast milk in a bottle or storage container. Where to find more information [employer]: llli.org Breastfeeding, Centers for Disease Control and Prevention (CDC): TonerPromos.no Breastfeeding and Returning to the Workplace, Marine scientist for Disease Control and Prevention (CDC): TonerPromos.no [employer] (ACOG) : acog.org WIC Breastfeeding Support: wicbreastfeeding.fns.usda.gov Contact a health care provider if: You are frustrated with breastfeeding. You are worried your baby is not getting enough milk. Signs of not getting enough milk include: Your baby is not gaining weight or loses weight. Your baby is more than 83 week old and wetting fewer than 6 diapers in a 24-hour period. You do not hear your baby swallowing during feeds. Your full breasts do not get softer after your baby breastfeeds. Your baby is frequently too sleepy to feed well or is crying and will not stop. You have pain or discomfort in your breasts such  as: Continued pain with breastfeeding. Breast engorgement that does not improve after 48-72 hours. Cracking or soreness in your nipples that does not get better with treatment. Bleeding from your nipples. This information is not intended to replace advice given to you by your health care provider. Make sure you discuss any questions you have with your health care provider. Document Revised: 08/03/2022 Document Reviewed: 07/07/2022 Elsevier Patient Education  2024 Elsevier Inc. Problems to Watch for  During Pregnancy During pregnancy, your body goes through many changes. Some changes may be uncomfortable. But most changes are not a serious problem. It's important to learn when certain signs and symptoms may be a problem. Talk with your health care provider about any medical conditions you have. Make sure you know the symptoms to watch for. Reporting problems early will prevent complications. Problems to watch for during pregnancy You're more likely to get an infection during pregnancy. Let your provider know if you have signs of infection, such as: A fever. A bad-smelling fluid from your vagina. Peeing too often, wanting to pee urgently, or pain when you pee. Also, let your provider know if: You're very tired, you feel dizzy, or you faint. You have watery poop (diarrhea) for 24 hours or longer. You throw up or feel like throwing up for 24 hours or longer. You have cramping in your belly or have pain in your hips or lower back. You have spotting, bleeding, or leaking of fluid from your vagina. You have pain, swelling, or redness in an arm or leg. You should also watch for signs of high blood pressure and preeclampsia. These signs can be very serious. They include: A headache that doesn't go away when you take medicine. Sudden or very bad swelling of your face, hands, legs, or feet. Problems seeing, such as: You see spots. You have blurry vision. You may be sensitive to light. Why it's important to watch for these problems Watching and reporting problems to your provider can help prevent complications that may affect you and your baby. These include: Higher risk of giving birth early. Infection that may be passed on to your baby. Higher risk for stillbirth. Follow these instructions at home:  Take your medicines only as told. Keep all follow-up visits. Your provider needs to monitor your health and your baby's health. Where to find more information To learn more, go to these  websites: Centers for Disease Control and Prevention (CDC) at TonerPromos.no. Then: Click Health Topics A-Z. Type urgent maternal warning signs in the search box. [employer] (ACOG): acog.org Contact a health care provider if: You have any problems while you're pregnant. You feel your baby moving less than usual. You have any of these things: You have strong emotions, such as sadness or anxiety, that affect your daily life. You do not feel safe in your home. You use tobacco, alcohol, or drugs, and you need help to stop. Get help right away if: You faint, have a seizure, or cannot think clearly. You have chest pain or difficulty breathing. You have any of the following symptoms and you were unable to reach your provider: You have symptoms of infection, including a fever, or have vaginal bleeding. You have symptoms of high blood pressure or preeclampsia. You have signs or symptoms of labor before 37 weeks of pregnancy. These include: Contractions that are 5 minutes or less apart, or that increase in frequency, intensity, or length. Sudden, sharp pain in the belly, or low back pain. Any amount  of fluid that flows from your vagina without stopping. These symptoms may be an emergency. Call 911 right away. Do not wait to see if the symptoms will go away. Do not drive yourself to the hospital. This information is not intended to replace advice given to you by your health care provider. Make sure you discuss any questions you have with your health care provider. Document Revised: 12/26/2022 Document Reviewed: 12/26/2022 Elsevier Patient Education  2024 [employer].

## 2024-07-27 NOTE — Assessment & Plan Note (Signed)
 VBAC consent with MD/DO in 3rd trimester

## 2024-07-28 LAB — MONITOR DRUG PROFILE 14(MW)
Amphetamine Scrn, Ur: NEGATIVE ng/mL
BARBITURATE SCREEN URINE: NEGATIVE ng/mL
BENZODIAZEPINE SCREEN, URINE: NEGATIVE ng/mL
Buprenorphine, Urine: NEGATIVE ng/mL
CANNABINOIDS UR QL SCN: NEGATIVE ng/mL
Cocaine (Metab) Scrn, Ur: NEGATIVE ng/mL
Creatinine(Crt), U: 167.2 mg/dL (ref 20.0–300.0)
Fentanyl, Urine: NEGATIVE pg/mL
Meperidine Screen, Urine: NEGATIVE ng/mL
Methadone Screen, Urine: NEGATIVE ng/mL
OXYCODONE+OXYMORPHONE UR QL SCN: NEGATIVE ng/mL
Opiate Scrn, Ur: NEGATIVE ng/mL
Ph of Urine: 6 (ref 4.5–8.9)
Phencyclidine Qn, Ur: NEGATIVE ng/mL
Propoxyphene Scrn, Ur: NEGATIVE ng/mL
SPECIFIC GRAVITY: 1.02
Tramadol Screen, Urine: NEGATIVE ng/mL

## 2024-07-28 LAB — NICOTINE SCREEN, URINE: Cotinine Ql Scrn, Ur: NEGATIVE ng/mL

## 2024-07-29 LAB — CERVICOVAGINAL ANCILLARY ONLY
Chlamydia: NEGATIVE
Comment: NEGATIVE
Comment: NORMAL
Neisseria Gonorrhea: NEGATIVE

## 2024-07-31 LAB — PANORAMA PRENATAL TEST FULL PANEL:PANORAMA TEST PLUS 5 ADDITIONAL MICRODELETIONS: FETAL FRACTION: 4.2

## 2024-08-04 ENCOUNTER — Ambulatory Visit: Payer: Self-pay | Admitting: Certified Nurse Midwife

## 2024-08-06 ENCOUNTER — Ambulatory Visit

## 2024-08-06 DIAGNOSIS — O0992 Supervision of high risk pregnancy, unspecified, second trimester: Secondary | ICD-10-CM | POA: Diagnosis not present

## 2024-08-06 DIAGNOSIS — O0990 Supervision of high risk pregnancy, unspecified, unspecified trimester: Secondary | ICD-10-CM

## 2024-08-06 DIAGNOSIS — Z3A14 14 weeks gestation of pregnancy: Secondary | ICD-10-CM

## 2024-08-06 DIAGNOSIS — Z98891 History of uterine scar from previous surgery: Secondary | ICD-10-CM

## 2024-08-06 DIAGNOSIS — Z8759 Personal history of other complications of pregnancy, childbirth and the puerperium: Secondary | ICD-10-CM

## 2024-08-06 LAB — HORIZON CUSTOM: REPORT SUMMARY: NEGATIVE

## 2024-08-11 ENCOUNTER — Other Ambulatory Visit

## 2024-08-12 ENCOUNTER — Other Ambulatory Visit: Payer: Self-pay

## 2024-08-12 ENCOUNTER — Emergency Department
Admission: EM | Admit: 2024-08-12 | Discharge: 2024-08-12 | Disposition: A | Discharge status: Home / Self Care | Attending: Emergency Medicine | Admitting: Emergency Medicine

## 2024-08-12 ENCOUNTER — Encounter: Payer: Self-pay | Admitting: Emergency Medicine

## 2024-08-12 DIAGNOSIS — R519 Headache, unspecified: Secondary | ICD-10-CM | POA: Insufficient documentation

## 2024-08-12 DIAGNOSIS — O26892 Other specified pregnancy related conditions, second trimester: Secondary | ICD-10-CM | POA: Insufficient documentation

## 2024-08-12 DIAGNOSIS — Z3A15 15 weeks gestation of pregnancy: Secondary | ICD-10-CM | POA: Insufficient documentation

## 2024-08-12 LAB — CBC WITH DIFFERENTIAL/PLATELET
Abs Immature Granulocytes: 0.01 K/uL (ref 0.00–0.07)
Basophils Absolute: 0 K/uL (ref 0.0–0.1)
Basophils Relative: 0 %
Eosinophils Absolute: 0.1 K/uL (ref 0.0–0.5)
Eosinophils Relative: 1 %
HCT: 38.5 % (ref 36.0–46.0)
Hemoglobin: 12.8 g/dL (ref 12.0–15.0)
Immature Granulocytes: 0 %
Lymphocytes Relative: 33 %
Lymphs Abs: 2.2 K/uL (ref 0.7–4.0)
MCH: 28.6 pg (ref 26.0–34.0)
MCHC: 33.2 g/dL (ref 30.0–36.0)
MCV: 85.9 fL (ref 80.0–100.0)
Monocytes Absolute: 0.4 K/uL (ref 0.1–1.0)
Monocytes Relative: 6 %
Neutro Abs: 4 K/uL (ref 1.7–7.7)
Neutrophils Relative %: 60 %
Platelets: 268 K/uL (ref 150–400)
RBC: 4.48 MIL/uL (ref 3.87–5.11)
RDW: 13.4 % (ref 11.5–15.5)
WBC: 6.8 K/uL (ref 4.0–10.5)
nRBC: 0 % (ref 0.0–0.2)

## 2024-08-12 LAB — BASIC METABOLIC PANEL WITH GFR
Anion gap: 11 (ref 5–15)
BUN: 11 mg/dL (ref 6–20)
CO2: 19 mmol/L — ABNORMAL LOW (ref 22–32)
Calcium: 9.4 mg/dL (ref 8.9–10.3)
Chloride: 107 mmol/L (ref 98–111)
Creatinine, Ser: 0.54 mg/dL (ref 0.44–1.00)
GFR, Estimated: >60 mL/min
Glucose, Bld: 91 mg/dL (ref 70–99)
Potassium: 4.2 mmol/L (ref 3.5–5.1)
Sodium: 137 mmol/L (ref 135–145)

## 2024-08-12 MED ORDER — METOCLOPRAMIDE HCL 5 MG/ML IJ SOLN
10.0000 mg | Freq: Once | INTRAMUSCULAR | Status: AC
  Administered 2024-08-12: 10 mg via INTRAVENOUS
  Filled 2024-08-12: qty 2

## 2024-08-12 MED ORDER — SODIUM CHLORIDE 0.9 % IV BOLUS
1000.0000 mL | Freq: Once | INTRAVENOUS | Status: AC
  Administered 2024-08-12: 1000 mL via INTRAVENOUS

## 2024-08-12 MED ORDER — METOCLOPRAMIDE HCL 5 MG PO TABS: 5.0000 mg | ORAL_TABLET | Freq: Three times a day (TID) | ORAL | 0 refills | Status: AC | PRN

## 2024-08-12 NOTE — ED Provider Triage Note (Signed)
 Emergency Medicine Provider Triage Evaluation Note  Jennifer Lamb , a 24 y.o. female  was evaluated in triage.  Pt complains of headache, is [redacted] weeks pregnant, hx of preeclampsia in other pregnancies.  Review of Systems  Positive:  Negative:   Physical Exam  BP 119/72 (BP Location: Right Arm)   Pulse (!) 124   Temp 98 F (36.7 C) (Oral)   Resp 17   LMP 04/15/2024 (Exact Date)   SpO2 100%  Gen:   Awake, no distress   Resp:  Normal effort  MSK:   Moves extremities without difficulty  Other:    Medical Decision Making  Medically screening exam initiated at 5:45 PM.  Appropriate orders placed.  HAILLIE RADU was informed that the remainder of the evaluation will be completed by another provider, this initial triage assessment does not replace that evaluation, and the importance of remaining in the ED until their evaluation is complete.  Ns 1 liter iv, reglan  iv   Gasper Devere ORN, PA-C 08/12/24 1746

## 2024-08-12 NOTE — ED Notes (Signed)
Pt given crackers and peanut butter.

## 2024-08-12 NOTE — ED Triage Notes (Signed)
 Pt reports headache, blurred vision, nausea, and SHOB. Pts OB told pt to come to ER.

## 2024-08-12 NOTE — Discharge Instructions (Addendum)
Follow-up with your regular doctor as needed.  Return if worsening. ?

## 2024-08-12 NOTE — ED Provider Notes (Signed)
 "  [facility] Provider Note    Event Date/Time   First MD Initiated Contact with Patient 08/12/24 RONOLD     (approximate)   History   Headache   HPI {Remember to add pertinent medical, surgical, social, and/or OB history to HPI:1} Jennifer Lamb is a 24 y.o. female  *** ***      Physical Exam   Triage Vital Signs: ED Triage Vitals  Encounter Vitals Group     BP 08/12/24 1740 119/72     Girls Systolic BP Percentile --      Girls Diastolic BP Percentile --      Boys Systolic BP Percentile --      Boys Diastolic BP Percentile --      Pulse Rate 08/12/24 1740 (!) 124     Resp 08/12/24 1740 17     Temp 08/12/24 1740 98 F (36.7 C)     Temp Source 08/12/24 1740 Oral     SpO2 08/12/24 1740 100 %     Weight --      Height --      Head Circumference --      Peak Flow --      Pain Score 08/12/24 1739 8     Pain Loc --      Pain Education --      Exclude from Growth Chart --     Most recent vital signs: Vitals:   08/12/24 1740  BP: 119/72  Pulse: (!) 124  Resp: 17  Temp: 98 F (36.7 C)  SpO2: 100%     General: Awake, no distress. ***  CV:  Good peripheral perfusion. ***regular rate and  rhythm*** Resp:  Normal effort. Lungs *** Abd:  No distention. ***  Other:  *** ***   ED Results / Procedures / Treatments   Labs (all labs ordered are listed, but only abnormal results are displayed) Labs Reviewed  BASIC METABOLIC PANEL WITH GFR  CBC WITH DIFFERENTIAL/PLATELET  URINALYSIS, ROUTINE W REFLEX MICROSCOPIC  POC URINE PREG, ED     EKG  ***   RADIOLOGY ***    PROCEDURES:   Procedures  Critical Care:  *** Chief Complaint  Patient presents with   Headache      MEDICATIONS ORDERED IN ED: Medications  sodium chloride  0.9 % bolus 1,000 mL (1,000 mLs Intravenous New Bag/Given 08/12/24 1807)  metoCLOPramide  (REGLAN ) injection 10 mg (10 mg Intravenous Given 08/12/24 1800)     IMPRESSION / MDM / ASSESSMENT AND  PLAN / ED COURSE  I reviewed the triage vital signs and the nursing notes.                              Differential diagnosis includes, but is not limited to, ***  Patient's presentation is most consistent with {EM COPA:27473}   Cardiac monitor *** Medications given***   {Remember to include, when applicable, any/all of the following data: independent review of imaging independent review of labs (comment specifically on pertinent positives and negatives) review of specific prior hospitalizations, PCP/specialist notes, etc. discuss meds given and prescribed document any discussion with consultants (including hospitalists) any clinical decision tools you used and why (PECARN, NEXUS, etc.) did you consider admitting the patient? document social determinants of health affecting patient's care (homelessness, inability to follow up in a timely fashion, etc) document any pre-existing conditions increasing risk on current visit (e.g. diabetes and HTN increasing danger of high-risk chest  pain/ACS) describes what meds you gave (especially parenteral) and why any other interventions?:1}      FINAL CLINICAL IMPRESSION(S) / ED DIAGNOSES   Final diagnoses:  None     Rx / DC Orders   ED Discharge Orders     None        Note:  This document was prepared using Dragon voice recognition software and may include unintentional dictation errors.  "

## 2024-08-12 NOTE — ED Provider Notes (Signed)
----------------------------------------- °  6:56 PM on 08/12/2024 -----------------------------------------  Blood pressure 119/72, pulse (!) 124, temperature 98 F (36.7 C), temperature source Oral, resp. rate 17, last menstrual period 04/15/2024, SpO2 100%.  Assuming care from Devere Perry, PA-C/NP-C.  In short, Jennifer Lamb is a 24 y.o. female with a chief complaint of Headache .  Refer to the original H&P for additional details.  The current plan of care is to await pending labs and completion of IV fluids.  ____________________________________________    ED Results / Procedures / Treatments   Labs (all labs ordered are listed, but only abnormal results are displayed) Labs Reviewed  CBC WITH DIFFERENTIAL/PLATELET  BASIC METABOLIC PANEL WITH GFR     EKG   RADIOLOGY  No results found.   PROCEDURES:  Critical Care performed: No  Procedures   MEDICATIONS ORDERED IN ED: Medications  sodium chloride  0.9 % bolus 1,000 mL (1,000 mLs Intravenous New Bag/Given 08/12/24 1807)  metoCLOPramide  (REGLAN ) injection 10 mg (10 mg Intravenous Given 08/12/24 1800)     IMPRESSION / MDM / ASSESSMENT AND PLAN / ED COURSE  I reviewed the triage vital signs and the nursing notes.                              Differential diagnosis includes, but is not limited to, premises gravidarum, N/V in pregnancy, headache, migraine or dehydration  Patient's presentation is most consistent with acute complicated illness / injury requiring diagnostic workup.  Patient's diagnosis is consistent with headache in pregnancy.  Patient presents in no acute risk, endorsing headache that has not been managed by Tylenol .  She denies any red flag symptoms associate with a headache.  She denies any pregnancy related complaints at this time.  She is treated in the ED with IV fluid bolus as well as IV antiemetics, and endorses complete resolution of her headache pain.  Patient is requesting discharge at  this time.  Patient will be discharged home with prescriptions for Reglan . Patient is to follow up with her OB provider as scheduled or as needed or otherwise directed. Patient is given ED precautions to return to the ED for any worsening or new symptoms.     FINAL CLINICAL IMPRESSION(S) / ED DIAGNOSES   Final diagnoses:  Pregnancy headache in second trimester     Rx / DC Orders   ED Discharge Orders          Ordered    metoCLOPramide  (REGLAN ) 5 MG tablet  Every 8 hours PRN        08/12/24 1856             Note:  This document was prepared using Dragon voice recognition software and may include unintentional dictation errors.    Loyd Candida LULLA Aldona, PA-C 08/12/24 1858    Waymond Lorelle Cummins, MD 08/12/24 [phone]

## 2024-08-22 ENCOUNTER — Encounter: Payer: Self-pay | Admitting: Certified Nurse Midwife

## 2024-08-22 ENCOUNTER — Ambulatory Visit: Admitting: Certified Nurse Midwife

## 2024-08-22 VITALS — BP 121/72 | HR 113 | Wt 265.2 lb

## 2024-08-22 DIAGNOSIS — Z3482 Encounter for supervision of other normal pregnancy, second trimester: Secondary | ICD-10-CM

## 2024-08-22 DIAGNOSIS — Z3A16 16 weeks gestation of pregnancy: Secondary | ICD-10-CM | POA: Diagnosis not present

## 2024-08-22 NOTE — Progress Notes (Signed)
 ROB doing well, is not feeling movement yet. She has MFM appointment for u/s on 2/11. Pt encouraged to keep appointment .  She denies any concerns today. Follow up in 4 weeks  for ROB.    Body mass index is 48.51 kg/m.

## 2024-08-22 NOTE — Patient Instructions (Signed)
 Round Ligament Pain  The round ligaments are a pair of cord-like tissues that help support the uterus. They can become a source of pain during pregnancy as the ligaments soften and stretch as the baby grows. The pain usually begins in the second trimester (13-28 weeks) of pregnancy, and should only last for a few seconds when it occurs. However, the pain can come and go until the baby is delivered. The pain does not cause harm to the baby. Round ligament pain is usually a short, sharp, and pinching pain, but it can also be a dull, lingering, and aching pain. The pain is felt in the lower side of the abdomen or in the groin. It usually starts deep in the groin and moves up to the outside of the hip area. The pain may happen when you: Suddenly change position, such as quickly going from a sitting to standing position. Do physical activity. Cough or sneeze. Follow these instructions at home: Managing pain  When the pain starts, relax. Then, try any of these methods to help with the pain: Sit down. Flex your knees up to your abdomen. Lie on your side with one pillow under your abdomen and another pillow between your legs. Sit in a warm bath for 15-20 minutes or until the pain goes away. General instructions Watch your condition for any changes. Move slowly when you sit down or stand up. Stop or reduce your physical activities if they cause pain. Avoid long walks if they cause pain. Take over-the-counter and prescription medicines only as told by your health care provider. Keep all follow-up visits. This is important. Contact a health care provider if: Your pain does not go away with treatment. You feel pain in your back that you did not have before. Your medicine is not helping. You have a fever or chills. You have nausea or vomiting. You have diarrhea. You have pain when you urinate. Get help right away if: You have pain that is a rhythmic, cramping pain similar to labor pains. Labor  pains are usually 2 minutes apart, last for about 1 minute, and involve a bearing down feeling or pressure in your pelvis. You have vaginal bleeding. These symptoms may represent a serious problem that is an emergency. Do not wait to see if the symptoms will go away. Get medical help right away. Call your local emergency services (911 in the [place] Do not drive yourself to the hospital. Summary Round ligament pain is felt in the lower abdomen or groin. This pain usually begins in the second trimester (13-28 weeks) and should only last for a few seconds when it occurs. You may notice the pain when you suddenly change position, when you cough or sneeze, or during physical activity. Relaxing, flexing your knees to your abdomen, lying on one side, or taking a warm bath may help to get rid of the pain. Contact your health care provider if the pain does not go away. This information is not intended to replace advice given to you by your health care provider. Make sure you discuss any questions you have with your health care provider. Document Revised: 09/29/2020 Document Reviewed: 09/29/2020 Elsevier Patient Education  2024 [employer].

## 2024-09-10 ENCOUNTER — Other Ambulatory Visit

## 2024-09-19 ENCOUNTER — Encounter: Admitting: Licensed Practical Nurse
